# Patient Record
Sex: Female | Born: 1970 | Race: White | Hispanic: No | Marital: Married | Smoking: Current every day smoker
Health system: Southern US, Community
[De-identification: ages and names within clinical notes are randomized; demographics above are authoritative.]

## PROBLEM LIST (undated history)

## (undated) DIAGNOSIS — R519 Headache, unspecified: Secondary | ICD-10-CM

## (undated) DIAGNOSIS — M810 Age-related osteoporosis without current pathological fracture: Secondary | ICD-10-CM

## (undated) DIAGNOSIS — M5126 Other intervertebral disc displacement, lumbar region: Secondary | ICD-10-CM

## (undated) DIAGNOSIS — R51 Headache: Secondary | ICD-10-CM

## (undated) DIAGNOSIS — M5136 Other intervertebral disc degeneration, lumbar region: Secondary | ICD-10-CM

## (undated) DIAGNOSIS — J45909 Unspecified asthma, uncomplicated: Secondary | ICD-10-CM

## (undated) HISTORY — PX: SHOULDER ARTHROSCOPY: SHX128

## (undated) HISTORY — PX: TUBAL LIGATION: SHX77

---

## 2016-02-24 ENCOUNTER — Ambulatory Visit (INDEPENDENT_AMBULATORY_CARE_PROVIDER_SITE_OTHER): Payer: Federal, State, Local not specified - PPO

## 2016-02-24 ENCOUNTER — Ambulatory Visit
Admission: EM | Admit: 2016-02-24 | Discharge: 2016-02-24 | Disposition: A | Discharge status: Home / Self Care | Payer: Federal, State, Local not specified - PPO | Attending: Family Medicine | Admitting: Family Medicine

## 2016-02-24 ENCOUNTER — Encounter: Payer: Self-pay | Admitting: *Deleted

## 2016-02-24 DIAGNOSIS — J40 Bronchitis, not specified as acute or chronic: Secondary | ICD-10-CM

## 2016-02-24 DIAGNOSIS — J4520 Mild intermittent asthma, uncomplicated: Secondary | ICD-10-CM

## 2016-02-24 HISTORY — DX: Unspecified asthma, uncomplicated: J45.909

## 2016-02-24 MED ORDER — ALBUTEROL SULFATE HFA 108 (90 BASE) MCG/ACT IN AERS: 2.0000 | INHALATION_SPRAY | Freq: Four times a day (QID) | RESPIRATORY_TRACT | Status: AC | PRN

## 2016-02-24 MED ORDER — BENZONATATE 100 MG PO CAPS: 100.0000 mg | ORAL_CAPSULE | Freq: Three times a day (TID) | ORAL | Status: DC

## 2016-02-24 MED ORDER — DOXYCYCLINE HYCLATE 100 MG PO TABS: 100.0000 mg | ORAL_TABLET | Freq: Two times a day (BID) | ORAL | Status: DC

## 2016-02-24 NOTE — ED Notes (Signed)
Productive cough- clear, and intermittent fever x3 weeks. At onset pt had a sore throat but that has resolved.

## 2016-02-24 NOTE — ED Provider Notes (Signed)
CSN: [id number]     Arrival date & time 02/24/16  1222 History   First MD Initiated Contact with Patient 02/24/16 1437     Chief Complaint  Patient presents with  . Cough  . Fever  . Nasal Congestion   (Consider location/radiation/quality/duration/timing/severity/associated sxs/prior Treatment) Patient is a 45 y.o. female presenting with cough and fever.  Cough Cough characteristics:  Productive Sputum characteristics:  Yellow Severity:  Moderate Onset quality:  Gradual Duration:  3 weeks Timing:  Intermittent Progression:  Waxing and waning Chronicity:  New Smoker: yes   Context: weather changes   Relieved by:  Nothing Ineffective treatments:  None tried Associated symptoms: chills, diaphoresis, fever, rhinorrhea, shortness of breath, sinus congestion and sore throat   Associated symptoms: no ear pain, no headaches and no myalgias   Fever:    Duration:  4 days   Timing:  Intermittent   Temp source:  Oral   Progression:  Worsening Rhinorrhea:    Quality:  Clear   Timing:  Intermittent   Progression:  Worsening Shortness of breath:    Severity:  Mild   Timing:  Intermittent Sore throat:    Severity:  Mild   Onset quality:  Gradual   Duration:  4 days   Progression:  Waxing and waning Fever Associated symptoms: chills, congestion, cough, rhinorrhea and sore throat   Associated symptoms: no ear pain, no headaches and no myalgias     Past Medical History  Diagnosis Date  . Asthma    Past Surgical History  Procedure Laterality Date  . Shoulder arthroscopy Left    History reviewed. No pertinent family history. Social History  Substance Use Topics  . Smoking status: Current Every Day Smoker -- 0.50 packs/day    Types: Cigarettes  . Smokeless tobacco: None  . Alcohol Use: Yes   OB History    No data available     Review of Systems  Constitutional: Positive for fever, chills and diaphoresis.  HENT: Positive for congestion, postnasal drip, rhinorrhea and  sore throat. Negative for ear pain, facial swelling and nosebleeds.   Respiratory: Positive for cough and shortness of breath. Negative for apnea.   Musculoskeletal: Negative for myalgias and arthralgias.  Skin: Negative.   Neurological: Negative for headaches.  Hematological: Negative.  Negative for adenopathy.  Psychiatric/Behavioral: Negative.     Allergies  Review of patient's allergies indicates no known allergies.  Home Medications   Prior to Admission medications   Medication Sig Start Date End Date Taking? Authorizing Provider  gabapentin (NEURONTIN) 100 MG capsule Take 100 mg by mouth 3 (three) times daily.   Yes Historical Provider, MD  naproxen (NAPROSYN) 500 MG tablet Take 500 mg by mouth 2 (two) times daily with a meal.   Yes Historical Provider, MD  albuterol (PROVENTIL HFA;VENTOLIN HFA) 108 (90 Base) MCG/ACT inhaler Inhale 2 puffs into the lungs every 6 (six) hours as needed for wheezing or shortness of breath. 02/24/16   Juline Patch, MD  benzonatate (TESSALON) 100 MG capsule Take 1 capsule (100 mg total) by mouth every 8 (eight) hours. 02/24/16   Juline Patch, MD  doxycycline (VIBRA-TABS) 100 MG tablet Take 1 tablet (100 mg total) by mouth 2 (two) times daily. 02/24/16   Juline Patch, MD  SUMAtriptan (IMITREX) 25 MG tablet Take 25 mg by mouth every 2 (two) hours as needed for migraine. May repeat in 2 hours if headache persists or recurs.    Historical Provider, MD   Meds Ordered  and Administered this Visit  Medications - No data to display  BP 111/64 mmHg  Pulse 86  Temp(Src) 98 F (36.7 C) (Oral)  Resp 16  Ht 5\' 5"  (1.651 m)  Wt 122 lb (55.339 kg)  BMI 20.30 kg/m2  SpO2 100%  LMP 02/24/2016 (Exact Date) No data found.   Physical Exam  Constitutional: She appears well-developed and well-nourished.  HENT:  Head: Normocephalic.  Right Ear: External ear normal.  Left Ear: External ear normal.  Nose: Nose normal.  Mouth/Throat: Oropharynx is clear and  moist.  Eyes: Pupils are equal, round, and reactive to light.  Neck: Normal range of motion.  Cardiovascular: Normal rate, regular rhythm and normal heart sounds.   No murmur heard. Pulmonary/Chest: Effort normal. No respiratory distress. She has wheezes. She has no rales. She exhibits no tenderness.  Abdominal: Soft.  Nursing note and vitals reviewed.   ED Course  Procedures (including critical care time)  Labs Review Labs Reviewed - No data to display  Imaging Review Dg Chest 2 View  02/24/2016  CLINICAL DATA:  Productive cough x3 weeks EXAM: CHEST  2 VIEW COMPARISON:  None. FINDINGS: Lungs are clear.  No pleural effusion or pneumothorax. The heart is normal in size. Visualized osseous structures are within normal limits. IMPRESSION: No evidence of acute cardiopulmonary disease. Electronically Signed   By: Julian Hy M.D.   On: 02/24/2016 14:45     Visual Acuity Review  Right Eye Distance:   Left Eye Distance:   Bilateral Distance:    Right Eye Near:   Left Eye Near:    Bilateral Near:         MDM   1. Bronchitis   2. Asthma, mild intermittent, uncomplicated    New Prescriptions   ALBUTEROL (PROVENTIL HFA;VENTOLIN HFA) 108 (90 BASE) MCG/ACT INHALER    Inhale 2 puffs into the lungs every 6 (six) hours as needed for wheezing or shortness of breath.   BENZONATATE (TESSALON) 100 MG CAPSULE    Take 1 capsule (100 mg total) by mouth every 8 (eight) hours.   DOXYCYCLINE (VIBRA-TABS) 100 MG TABLET    Take 1 tablet (100 mg total) by mouth 2 (two) times daily.     Juline Patch, MD 02/25/16 1224

## 2016-02-24 NOTE — Discharge Instructions (Signed)
Asthma, Acute Bronchospasm °Acute bronchospasm caused by asthma is also referred to as an asthma attack. Bronchospasm means your air passages become narrowed. The narrowing is caused by inflammation and tightening of the muscles in the air tubes (bronchi) in your lungs. This can make it hard to breathe or cause you to wheeze and cough. °CAUSES °Possible triggers are: °· Animal dander from the skin, hair, or feathers of animals. °· Dust mites contained in house dust. °· Cockroaches. °· Pollen from trees or grass. °· Mold. °· Cigarette or tobacco smoke. °· Air pollutants such as dust, household cleaners, hair sprays, aerosol sprays, paint fumes, strong chemicals, or strong odors. °· Cold air or weather changes. Cold air may trigger inflammation. Winds increase molds and pollens in the air. °· Strong emotions such as crying or laughing hard. °· Stress. °· Certain medicines such as aspirin or beta-blockers. °· Sulfites in foods and drinks, such as dried fruits and wine. °· Infections or inflammatory conditions, such as a flu, cold, or inflammation of the nasal membranes (rhinitis). °· Gastroesophageal reflux disease (GERD). GERD is a condition where stomach acid backs up into your esophagus. °· Exercise or strenuous activity. °SIGNS AND SYMPTOMS  °· Wheezing. °· Excessive coughing, particularly at night. °· Chest tightness. °· Shortness of breath. °DIAGNOSIS  °Your health care provider will ask you about your medical history and perform a physical exam. A chest X-ray or blood testing may be performed to look for other causes of your symptoms or other conditions that may have triggered your asthma attack.  °TREATMENT  °Treatment is aimed at reducing inflammation and opening up the airways in your lungs.  Most asthma attacks are treated with inhaled medicines. These include quick relief or rescue medicines (such as bronchodilators) and controller medicines (such as inhaled corticosteroids). These medicines are sometimes  given through an inhaler or a nebulizer. Systemic steroid medicine taken by mouth or given through an IV tube also can be used to reduce the inflammation when an attack is moderate or severe. Antibiotic medicines are only used if a bacterial infection is present.  °HOME CARE INSTRUCTIONS  °· Rest. °· Drink plenty of liquids. This helps the mucus to remain thin and be easily coughed up. Only use caffeine in moderation and do not use alcohol until you have recovered from your illness. °· Do not smoke. Avoid being exposed to secondhand smoke. °· You play a critical role in keeping yourself in good health. Avoid exposure to things that cause you to wheeze or to have breathing problems. °· Keep your medicines up-to-date and available. Carefully follow your health care provider's treatment plan. °· Take your medicine exactly as prescribed. °· When pollen or pollution is bad, keep windows closed and use an air conditioner or go to places with air conditioning. °· Asthma requires careful medical care. See your health care provider for a follow-up as advised. If you are more than [redacted] weeks pregnant and you were prescribed any new medicines, let your obstetrician know about the visit and how you are doing. Follow up with your health care provider as directed. °· After you have recovered from your asthma attack, make an appointment with your outpatient doctor to talk about ways to reduce the likelihood of future attacks. If you do not have a doctor who manages your asthma, make an appointment with a primary care doctor to discuss your asthma. °SEEK IMMEDIATE MEDICAL CARE IF:  °· You are getting worse. °· You have trouble breathing. If severe, call your local   emergency services (911 in the [place]  You develop chest pain or discomfort.  You are vomiting.  You are not able to keep fluids down.  You are coughing up yellow, green, brown, or bloody sputum.  You have a fever and your symptoms suddenly get worse.  You have  trouble swallowing. MAKE SURE YOU:   Understand these instructions.  Will watch your condition.  Will get help right away if you are not doing well or get worse.   This information is not intended to replace advice given to you by your health care provider. Make sure you discuss any questions you have with your health care provider.   Document Released: 03/04/2007 Document Revised: 11/22/2013 Document Reviewed: 05/25/2013 Elsevier Interactive Patient Education 2016 [place].  Asthma, Acute Bronchospasm Acute bronchospasm caused by asthma is also referred to as an asthma attack. Bronchospasm means your air passages become narrowed. The narrowing is caused by inflammation and tightening of the muscles in the air tubes (bronchi) in your lungs. This can make it hard to breathe or cause you to wheeze and cough. CAUSES Possible triggers are:  Animal dander from the skin, hair, or feathers of animals.  Dust mites contained in house dust.  Cockroaches.  Pollen from trees or grass.  Mold.  Cigarette or tobacco smoke.  Air pollutants such as dust, household cleaners, hair sprays, aerosol sprays, paint fumes, strong chemicals, or strong odors.  Cold air or weather changes. Cold air may trigger inflammation. Winds increase molds and pollens in the air.  Strong emotions such as crying or laughing hard.  Stress.  Certain medicines such as aspirin or beta-blockers.  Sulfites in foods and drinks, such as dried fruits and wine.  Infections or inflammatory conditions, such as a flu, cold, or inflammation of the nasal membranes (rhinitis).  Gastroesophageal reflux disease (GERD). GERD is a condition where stomach acid backs up into your esophagus.  Exercise or strenuous activity. SIGNS AND SYMPTOMS   Wheezing.  Excessive coughing, particularly at night.  Chest tightness.  Shortness of breath. DIAGNOSIS  Your health care provider will ask you about your medical history and  perform a physical exam. A chest X-ray or blood testing may be performed to look for other causes of your symptoms or other conditions that may have triggered your asthma attack. TREATMENT  Treatment is aimed at reducing inflammation and opening up the airways in your lungs. Most asthma attacks are treated with inhaled medicines. These include quick relief or rescue medicines (such as bronchodilators) and controller medicines (such as inhaled corticosteroids). These medicines are sometimes given through an inhaler or a nebulizer. Systemic steroid medicine taken by mouth or given through an IV tube also can be used to reduce the inflammation when an attack is moderate or severe. Antibiotic medicines are only used if a bacterial infection is present.  HOME CARE INSTRUCTIONS   Rest.  Drink plenty of liquids. This helps the mucus to remain thin and be easily coughed up. Only use caffeine in moderation and do not use alcohol until you have recovered from your illness.  Do not smoke. Avoid being exposed to secondhand smoke.  You play a critical role in keeping yourself in good health. Avoid exposure to things that cause you to wheeze or to have breathing problems.  Keep your medicines up-to-date and available. Carefully follow your health care provider's treatment plan.  Take your medicine exactly as prescribed.  When pollen or pollution is bad, keep windows closed and use an  air conditioner or go to places with air conditioning.  Asthma requires careful medical care. See your health care provider for a follow-up as advised. If you are more than [redacted] weeks pregnant and you were prescribed any new medicines, let your obstetrician know about the visit and how you are doing. Follow up with your health care provider as directed.  After you have recovered from your asthma attack, make an appointment with your outpatient doctor to talk about ways to reduce the likelihood of future attacks. If you do not  have a doctor who manages your asthma, make an appointment with a primary care doctor to discuss your asthma. SEEK IMMEDIATE MEDICAL CARE IF:   You are getting worse.  You have trouble breathing. If severe, call your local emergency services (911 in the [place]  You develop chest pain or discomfort.  You are vomiting.  You are not able to keep fluids down.  You are coughing up yellow, green, brown, or bloody sputum.  You have a fever and your symptoms suddenly get worse.  You have trouble swallowing. MAKE SURE YOU:   Understand these instructions.  Will watch your condition.  Will get help right away if you are not doing well or get worse.   This information is not intended to replace advice given to you by your health care provider. Make sure you discuss any questions you have with your health care provider.   Document Released: 03/04/2007 Document Revised: 11/22/2013 Document Reviewed: 05/25/2013 Elsevier Interactive Patient Education 2016 [place].  Asthma, Acute Bronchospasm Acute bronchospasm caused by asthma is also referred to as an asthma attack. Bronchospasm means your air passages become narrowed. The narrowing is caused by inflammation and tightening of the muscles in the air tubes (bronchi) in your lungs. This can make it hard to breathe or cause you to wheeze and cough. CAUSES Possible triggers are:  Animal dander from the skin, hair, or feathers of animals.  Dust mites contained in house dust.  Cockroaches.  Pollen from trees or grass.  Mold.  Cigarette or tobacco smoke.  Air pollutants such as dust, household cleaners, hair sprays, aerosol sprays, paint fumes, strong chemicals, or strong odors.  Cold air or weather changes. Cold air may trigger inflammation. Winds increase molds and pollens in the air.  Strong emotions such as crying or laughing hard.  Stress.  Certain medicines such as aspirin or beta-blockers.  Sulfites in foods and  drinks, such as dried fruits and wine.  Infections or inflammatory conditions, such as a flu, cold, or inflammation of the nasal membranes (rhinitis).  Gastroesophageal reflux disease (GERD). GERD is a condition where stomach acid backs up into your esophagus.  Exercise or strenuous activity. SIGNS AND SYMPTOMS   Wheezing.  Excessive coughing, particularly at night.  Chest tightness.  Shortness of breath. DIAGNOSIS  Your health care provider will ask you about your medical history and perform a physical exam. A chest X-ray or blood testing may be performed to look for other causes of your symptoms or other conditions that may have triggered your asthma attack. TREATMENT  Treatment is aimed at reducing inflammation and opening up the airways in your lungs. Most asthma attacks are treated with inhaled medicines. These include quick relief or rescue medicines (such as bronchodilators) and controller medicines (such as inhaled corticosteroids). These medicines are sometimes given through an inhaler or a nebulizer. Systemic steroid medicine taken by mouth or given through an IV tube also can be used to reduce  the inflammation when an attack is moderate or severe. Antibiotic medicines are only used if a bacterial infection is present.  HOME CARE INSTRUCTIONS   Rest.  Drink plenty of liquids. This helps the mucus to remain thin and be easily coughed up. Only use caffeine in moderation and do not use alcohol until you have recovered from your illness.  Do not smoke. Avoid being exposed to secondhand smoke.  You play a critical role in keeping yourself in good health. Avoid exposure to things that cause you to wheeze or to have breathing problems.  Keep your medicines up-to-date and available. Carefully follow your health care provider's treatment plan.  Take your medicine exactly as prescribed.  When pollen or pollution is bad, keep windows closed and use an air conditioner or go to  places with air conditioning.  Asthma requires careful medical care. See your health care provider for a follow-up as advised. If you are more than [redacted] weeks pregnant and you were prescribed any new medicines, let your obstetrician know about the visit and how you are doing. Follow up with your health care provider as directed.  After you have recovered from your asthma attack, make an appointment with your outpatient doctor to talk about ways to reduce the likelihood of future attacks. If you do not have a doctor who manages your asthma, make an appointment with a primary care doctor to discuss your asthma. SEEK IMMEDIATE MEDICAL CARE IF:   You are getting worse.  You have trouble breathing. If severe, call your local emergency services (911 in the [place]  You develop chest pain or discomfort.  You are vomiting.  You are not able to keep fluids down.  You are coughing up yellow, green, brown, or bloody sputum.  You have a fever and your symptoms suddenly get worse.  You have trouble swallowing. MAKE SURE YOU:   Understand these instructions.  Will watch your condition.  Will get help right away if you are not doing well or get worse.   This information is not intended to replace advice given to you by your health care provider. Make sure you discuss any questions you have with your health care provider.   Document Released: 03/04/2007 Document Revised: 11/22/2013 Document Reviewed: 05/25/2013 Elsevier Interactive Patient Education [employer].

## 2017-05-27 ENCOUNTER — Ambulatory Visit
Admission: EM | Admit: 2017-05-27 | Discharge: 2017-05-27 | Discharge status: Absent without leave | Payer: Federal, State, Local not specified - PPO

## 2017-07-03 ENCOUNTER — Other Ambulatory Visit
Admission: RE | Admit: 2017-07-03 | Discharge: 2017-07-03 | Disposition: A | Discharge status: Home / Self Care | Payer: Federal, State, Local not specified - PPO | Source: Ambulatory Visit | Attending: Gastroenterology | Admitting: Gastroenterology

## 2017-07-03 DIAGNOSIS — K581 Irritable bowel syndrome with constipation: Secondary | ICD-10-CM | POA: Diagnosis not present

## 2017-07-21 ENCOUNTER — Other Ambulatory Visit: Payer: Self-pay | Admitting: Orthopedic Surgery

## 2017-07-21 DIAGNOSIS — M542 Cervicalgia: Secondary | ICD-10-CM

## 2017-07-21 DIAGNOSIS — R202 Paresthesia of skin: Secondary | ICD-10-CM

## 2017-07-21 DIAGNOSIS — M4722 Other spondylosis with radiculopathy, cervical region: Secondary | ICD-10-CM

## 2017-07-21 DIAGNOSIS — R200 Anesthesia of skin: Secondary | ICD-10-CM

## 2017-07-22 LAB — MISCELLANEOUS TEST

## 2017-12-02 ENCOUNTER — Ambulatory Visit
Admission: RE | Admit: 2017-12-02 | Discharge: 2017-12-02 | Disposition: A | Discharge status: Home / Self Care | Payer: Federal, State, Local not specified - PPO | Source: Ambulatory Visit | Attending: Orthopedic Surgery | Admitting: Orthopedic Surgery

## 2017-12-02 HISTORY — DX: Headache: R51

## 2017-12-02 HISTORY — DX: Headache, unspecified: R51.9

## 2017-12-02 NOTE — Patient Instructions (Signed)
Your procedure is scheduled on: 12/14/17 Report to Day Surgery.[facility]OND FLOOR To find out your arrival time please call [phone] between 1PM - 3PM on 12/11/17.  Remember: Instructions that are not followed completely may result in serious medical risk, up to and including death, or upon the discretion of your surgeon and anesthesiologist your surgery may need to be rescheduled.     _X__ 1. Do not eat food after midnight the night before your procedure.                 No gum chewing or hard candies. You may drink clear liquids up to 2 hours                 before you are scheduled to arrive for your surgery- DO not drink clear                 liquids within 2 hours of the start of your surgery.                 Clear Liquids include:  water, apple juice without pulp, clear carbohydrate                 drink such as Clearfast of Gartorade, Black Coffee or Tea (Do not add                 anything to coffee or tea).     _X__ 2.  No Alcohol for 24 hours before or after surgery.   _X__ 3.  Do Not Smoke or use e-cigarettes For 24 Hours Prior to Your Surgery.                 Do not use any chewable tobacco products for at least 6 hours prior to                 surgery.  ____  4.  Bring all medications with you on the day of surgery if instructed.   __X__  5.  Notify your doctor if there is any change in your medical condition      (cold, fever, infections).     Do not wear jewelry, make-up, hairpins, clips or nail polish. Do not wear lotions, powders, or perfumes. You may wear deodorant. Do not shave 48 hours prior to surgery. Men may shave face and neck. Do not bring valuables to the hospital.    [facility] is not responsible for any belongings or valuables.  Contacts, dentures or bridgework may not be worn into surgery. Leave your suitcase in the car. After surgery it may be brought to your room. For patients admitted to the hospital, discharge time  is determined by your treatment team.   Patients discharged the day of surgery will not be allowed to drive home.   ___ Take these medicines the morning of surgery with A SIP OF WATER:    1. NONE  2.   3.   4.  5.  6.  ____ Fleet Enema (as directed)   ____ Use CHG Soap as directed  __X__ Use inhalers on the day of surgery  ____ Stop metformin 2 days prior to surgery    ____ Take 1/2 of usual insulin dose the night before surgery. No insulin the morning          of surgery.   _X___ Stop Coumadin/Plavix/aspirin on    STOP EXCEDRIN MIGRAINE UNTIL AFTER SURGERY  __X__ Stop Anti-inflammatories on STOP LODINE 7 TO  10 DAYS BEFORE SURGERY ____ Stop supplements until after surgery.    ____ Bring C-Pap to the hospital.

## 2017-12-10 ENCOUNTER — Encounter: Payer: Self-pay | Admitting: *Deleted

## 2017-12-13 MED ORDER — CEFAZOLIN SODIUM-DEXTROSE 2-4 GM/100ML-% IV SOLN
2.0000 g | Freq: Once | INTRAVENOUS | Status: AC
  Administered 2017-12-14: 2 g via INTRAVENOUS

## 2017-12-14 ENCOUNTER — Encounter
Admission: RE | Disposition: A | Discharge status: Home / Self Care | Payer: Self-pay | Source: Ambulatory Visit | Attending: Orthopedic Surgery

## 2017-12-14 ENCOUNTER — Ambulatory Visit: Payer: Worker's Compensation

## 2017-12-14 ENCOUNTER — Ambulatory Visit: Payer: Worker's Compensation | Admitting: Certified Registered"

## 2017-12-14 ENCOUNTER — Ambulatory Visit
Admission: RE | Admit: 2017-12-14 | Discharge: 2017-12-14 | Disposition: A | Discharge status: Home / Self Care | Payer: Worker's Compensation | Source: Ambulatory Visit | Attending: Orthopedic Surgery | Admitting: Orthopedic Surgery

## 2017-12-14 ENCOUNTER — Encounter: Payer: Self-pay | Admitting: *Deleted

## 2017-12-14 ENCOUNTER — Other Ambulatory Visit: Payer: Self-pay

## 2017-12-14 DIAGNOSIS — M7552 Bursitis of left shoulder: Secondary | ICD-10-CM | POA: Insufficient documentation

## 2017-12-14 DIAGNOSIS — M65812 Other synovitis and tenosynovitis, left shoulder: Secondary | ICD-10-CM | POA: Diagnosis not present

## 2017-12-14 DIAGNOSIS — M75112 Incomplete rotator cuff tear or rupture of left shoulder, not specified as traumatic: Secondary | ICD-10-CM | POA: Insufficient documentation

## 2017-12-14 DIAGNOSIS — Z79899 Other long term (current) drug therapy: Secondary | ICD-10-CM | POA: Insufficient documentation

## 2017-12-14 DIAGNOSIS — M7502 Adhesive capsulitis of left shoulder: Secondary | ICD-10-CM | POA: Insufficient documentation

## 2017-12-14 DIAGNOSIS — Z419 Encounter for procedure for purposes other than remedying health state, unspecified: Secondary | ICD-10-CM

## 2017-12-14 DIAGNOSIS — Z8249 Family history of ischemic heart disease and other diseases of the circulatory system: Secondary | ICD-10-CM | POA: Insufficient documentation

## 2017-12-14 DIAGNOSIS — J45909 Unspecified asthma, uncomplicated: Secondary | ICD-10-CM | POA: Diagnosis not present

## 2017-12-14 DIAGNOSIS — M7542 Impingement syndrome of left shoulder: Secondary | ICD-10-CM | POA: Diagnosis not present

## 2017-12-14 DIAGNOSIS — M810 Age-related osteoporosis without current pathological fracture: Secondary | ICD-10-CM | POA: Diagnosis not present

## 2017-12-14 HISTORY — DX: Other intervertebral disc displacement, lumbar region: M51.26

## 2017-12-14 HISTORY — DX: Age-related osteoporosis without current pathological fracture: M81.0

## 2017-12-14 HISTORY — PX: SHOULDER ARTHROSCOPY WITH OPEN ROTATOR CUFF REPAIR: SHX6092

## 2017-12-14 HISTORY — DX: Other intervertebral disc degeneration, lumbar region: M51.36

## 2017-12-14 LAB — POCT PREGNANCY, URINE: Preg Test, Ur: NEGATIVE

## 2017-12-14 SURGERY — ARTHROSCOPY, SHOULDER WITH REPAIR, ROTATOR CUFF, OPEN
Anesthesia: General | Site: Shoulder | Laterality: Left | Wound class: Clean

## 2017-12-14 MED ORDER — LIDOCAINE HCL (PF) 2 % IJ SOLN
INTRAMUSCULAR | Status: AC
  Filled 2017-12-14: qty 10

## 2017-12-14 MED ORDER — LIDOCAINE-EPINEPHRINE 1 %-1:100000 IJ SOLN
INTRAMUSCULAR | Status: AC
  Filled 2017-12-14: qty 1

## 2017-12-14 MED ORDER — ONDANSETRON HCL 4 MG/2ML IJ SOLN
INTRAMUSCULAR | Status: DC | PRN
  Administered 2017-12-14: 4 mg via INTRAVENOUS

## 2017-12-14 MED ORDER — FENTANYL CITRATE (PF) 100 MCG/2ML IJ SOLN
INTRAMUSCULAR | Status: AC
  Administered 2017-12-14: 50 ug via INTRAVENOUS
  Filled 2017-12-14: qty 2

## 2017-12-14 MED ORDER — LIDOCAINE-EPINEPHRINE 1 %-1:100000 IJ SOLN
INTRAMUSCULAR | Status: DC | PRN
  Administered 2017-12-14: 8 mL via INTRAMUSCULAR
  Administered 2017-12-14: 5 mL via INTRAMUSCULAR

## 2017-12-14 MED ORDER — CEFAZOLIN SODIUM-DEXTROSE 2-4 GM/100ML-% IV SOLN
INTRAVENOUS | Status: AC
  Filled 2017-12-14: qty 100

## 2017-12-14 MED ORDER — EPINEPHRINE PF 1 MG/ML IJ SOLN
INTRAMUSCULAR | Status: DC | PRN
  Administered 2017-12-14: 4 mL
  Administered 2017-12-14: 2 mL
  Administered 2017-12-14 (×3): 4 mL

## 2017-12-14 MED ORDER — PROPOFOL 10 MG/ML IV BOLUS
INTRAVENOUS | Status: AC
  Filled 2017-12-14: qty 20

## 2017-12-14 MED ORDER — BUPIVACAINE HCL (PF) 0.5 % IJ SOLN
INTRAMUSCULAR | Status: AC
  Filled 2017-12-14: qty 10

## 2017-12-14 MED ORDER — EPINEPHRINE 30 MG/30ML IJ SOLN
INTRAMUSCULAR | Status: AC
  Filled 2017-12-14: qty 2

## 2017-12-14 MED ORDER — DEXAMETHASONE SODIUM PHOSPHATE 10 MG/ML IJ SOLN
INTRAMUSCULAR | Status: DC | PRN
  Administered 2017-12-14: 5 mg via INTRAVENOUS

## 2017-12-14 MED ORDER — FENTANYL CITRATE (PF) 100 MCG/2ML IJ SOLN: 25.0000 ug | INTRAMUSCULAR | Status: DC | PRN

## 2017-12-14 MED ORDER — MIDAZOLAM HCL 2 MG/2ML IJ SOLN
1.0000 mg | Freq: Once | INTRAMUSCULAR | Status: AC
  Administered 2017-12-14: 1 mg via INTRAVENOUS

## 2017-12-14 MED ORDER — MIDAZOLAM HCL 2 MG/2ML IJ SOLN
INTRAMUSCULAR | Status: AC
  Administered 2017-12-14: 1 mg via INTRAVENOUS
  Filled 2017-12-14: qty 2

## 2017-12-14 MED ORDER — BUPIVACAINE LIPOSOME 1.3 % IJ SUSP
INTRAMUSCULAR | Status: DC | PRN
  Administered 2017-12-14: 20 mL via PERINEURAL

## 2017-12-14 MED ORDER — PROMETHAZINE HCL 25 MG/ML IJ SOLN: 6.2500 mg | INTRAMUSCULAR | Status: DC | PRN

## 2017-12-14 MED ORDER — FAMOTIDINE 20 MG PO TABS
ORAL_TABLET | ORAL | Status: AC
  Administered 2017-12-14: 20 mg via ORAL
  Filled 2017-12-14: qty 1

## 2017-12-14 MED ORDER — FENTANYL CITRATE (PF) 100 MCG/2ML IJ SOLN
50.0000 ug | Freq: Once | INTRAMUSCULAR | Status: AC
  Administered 2017-12-14: 50 ug via INTRAVENOUS

## 2017-12-14 MED ORDER — BUPIVACAINE HCL (PF) 0.5 % IJ SOLN
INTRAMUSCULAR | Status: DC | PRN
  Administered 2017-12-14: 10 mL via PERINEURAL

## 2017-12-14 MED ORDER — IBUPROFEN 800 MG PO TABS: 800.0000 mg | ORAL_TABLET | Freq: Three times a day (TID) | ORAL | 0 refills | Status: AC

## 2017-12-14 MED ORDER — LACTATED RINGERS IV SOLN
INTRAVENOUS | Status: DC
  Administered 2017-12-14 (×3): via INTRAVENOUS

## 2017-12-14 MED ORDER — LIDOCAINE HCL (PF) 1 % IJ SOLN
INTRAMUSCULAR | Status: DC | PRN
  Administered 2017-12-14: 5 mL via SUBCUTANEOUS

## 2017-12-14 MED ORDER — ROCURONIUM BROMIDE 50 MG/5ML IV SOLN
INTRAVENOUS | Status: AC
  Filled 2017-12-14: qty 1

## 2017-12-14 MED ORDER — OXYCODONE HCL 5 MG PO TABS: 5.0000 mg | ORAL_TABLET | ORAL | 0 refills | Status: DC | PRN

## 2017-12-14 MED ORDER — LIDOCAINE HCL (CARDIAC) 20 MG/ML IV SOLN
INTRAVENOUS | Status: DC | PRN
  Administered 2017-12-14: 50 mg via INTRAVENOUS

## 2017-12-14 MED ORDER — BUPIVACAINE LIPOSOME 1.3 % IJ SUSP
INTRAMUSCULAR | Status: AC
  Filled 2017-12-14: qty 20

## 2017-12-14 MED ORDER — DEXAMETHASONE SODIUM PHOSPHATE 10 MG/ML IJ SOLN
INTRAMUSCULAR | Status: AC
  Filled 2017-12-14: qty 1

## 2017-12-14 MED ORDER — TRIAMCINOLONE ACETONIDE 40 MG/ML IJ SUSP
INTRAMUSCULAR | Status: AC
  Filled 2017-12-14: qty 2

## 2017-12-14 MED ORDER — ACETAMINOPHEN 500 MG PO TABS: 1000.0000 mg | ORAL_TABLET | Freq: Three times a day (TID) | ORAL | 2 refills | Status: DC

## 2017-12-14 MED ORDER — FENTANYL CITRATE (PF) 100 MCG/2ML IJ SOLN
INTRAMUSCULAR | Status: AC
  Filled 2017-12-14: qty 2

## 2017-12-14 MED ORDER — BUPIVACAINE HCL (PF) 0.5 % IJ SOLN
INTRAMUSCULAR | Status: AC
  Filled 2017-12-14: qty 30

## 2017-12-14 MED ORDER — TRIAMCINOLONE ACETONIDE 40 MG/ML IJ SUSP
INTRAMUSCULAR | Status: DC | PRN
Start: 2017-12-14 — End: 2017-12-14
  Administered 2017-12-14: 80 mg

## 2017-12-14 MED ORDER — ONDANSETRON HCL 4 MG/2ML IJ SOLN
INTRAMUSCULAR | Status: AC
  Filled 2017-12-14: qty 2

## 2017-12-14 MED ORDER — ASPIRIN EC 325 MG PO TBEC: 325.0000 mg | DELAYED_RELEASE_TABLET | Freq: Every day | ORAL | 0 refills | Status: AC

## 2017-12-14 MED ORDER — FENTANYL CITRATE (PF) 100 MCG/2ML IJ SOLN
INTRAMUSCULAR | Status: DC | PRN
  Administered 2017-12-14 (×2): 50 ug via INTRAVENOUS

## 2017-12-14 MED ORDER — SUGAMMADEX SODIUM 200 MG/2ML IV SOLN
INTRAVENOUS | Status: DC | PRN
  Administered 2017-12-14: 200 mg via INTRAVENOUS

## 2017-12-14 MED ORDER — LIDOCAINE HCL (PF) 1 % IJ SOLN
INTRAMUSCULAR | Status: AC
  Filled 2017-12-14: qty 5

## 2017-12-14 MED ORDER — PHENYLEPHRINE HCL 10 MG/ML IJ SOLN
INTRAMUSCULAR | Status: DC | PRN
  Administered 2017-12-14 (×3): 100 ug via INTRAVENOUS

## 2017-12-14 MED ORDER — ROCURONIUM BROMIDE 100 MG/10ML IV SOLN
INTRAVENOUS | Status: DC | PRN
  Administered 2017-12-14: 50 mg via INTRAVENOUS

## 2017-12-14 MED ORDER — PROPOFOL 10 MG/ML IV BOLUS
INTRAVENOUS | Status: DC | PRN
  Administered 2017-12-14: 150 mg via INTRAVENOUS

## 2017-12-14 MED ORDER — FAMOTIDINE 20 MG PO TABS
20.0000 mg | ORAL_TABLET | Freq: Once | ORAL | Status: AC
  Administered 2017-12-14: 20 mg via ORAL

## 2017-12-14 MED ORDER — ONDANSETRON 4 MG PO TBDP: 4.0000 mg | ORAL_TABLET | Freq: Three times a day (TID) | ORAL | 0 refills | Status: DC | PRN

## 2017-12-14 SURGICAL SUPPLY — 80 items
ADAPTER IRRIG TUBE 2 SPIKE SOL (ADAPTER) ×6 IMPLANT
BLADE OSCILLATING/SAGITTAL (BLADE)
BLADE SW THK.38XMED LNG THN (BLADE) IMPLANT
BUR RADIUS 4.0X18.5 (BURR) ×3 IMPLANT
BUR RADIUS 5.5 (BURR) IMPLANT
CANNULA 5.75X7 CRYSTAL CLEAR (CANNULA) ×3 IMPLANT
CANNULA PARTIAL THREAD 2X7 (CANNULA) ×3 IMPLANT
CANNULA TWIST IN 8.25X9CM (CANNULA) IMPLANT
CHLORAPREP W/TINT 26ML (MISCELLANEOUS) ×3 IMPLANT
CLOSURE WOUND 1/2 X4 (GAUZE/BANDAGES/DRESSINGS)
COOLER POLAR GLACIER W/PUMP (MISCELLANEOUS) ×3 IMPLANT
COVER LIGHT HANDLE STERIS (MISCELLANEOUS) ×3 IMPLANT
CRADLE LAMINECT ARM (MISCELLANEOUS) ×3 IMPLANT
DERMABOND ADVANCED (GAUZE/BANDAGES/DRESSINGS)
DERMABOND ADVANCED .7 DNX12 (GAUZE/BANDAGES/DRESSINGS) IMPLANT
DRAPE C-ARM XRAY 36X54 (DRAPES) ×3 IMPLANT
DRAPE IMP U-DRAPE 54X76 (DRAPES) ×3 IMPLANT
DRAPE INCISE IOBAN 66X45 STRL (DRAPES) ×3 IMPLANT
DRAPE SHEET LG 3/4 BI-LAMINATE (DRAPES) ×6 IMPLANT
DRAPE STERI 35X30 U-POUCH (DRAPES) IMPLANT
DRAPE U-SHAPE 47X51 STRL (DRAPES) IMPLANT
DRSG TELFA 4X3 1S NADH ST (GAUZE/BANDAGES/DRESSINGS) ×9 IMPLANT
ELECT REM PT RETURN 9FT ADLT (ELECTROSURGICAL) ×3
ELECTRODE REM PT RTRN 9FT ADLT (ELECTROSURGICAL) ×1 IMPLANT
GAUZE PETRO XEROFOAM 1X8 (MISCELLANEOUS) ×3 IMPLANT
GAUZE SPONGE 4X4 12PLY STRL (GAUZE/BANDAGES/DRESSINGS) IMPLANT
GAUZE SPONGE NON-WVN 2X2 STRL (MISCELLANEOUS) ×5 IMPLANT
GLOVE BIOGEL PI IND STRL 8 (GLOVE) ×4 IMPLANT
GLOVE BIOGEL PI INDICATOR 8 (GLOVE) ×8
GLOVE SURG SYN 7.5  E (GLOVE) ×8
GLOVE SURG SYN 7.5 E (GLOVE) ×4 IMPLANT
GOWN STRL REUS W/ TWL LRG LVL3 (GOWN DISPOSABLE) ×3 IMPLANT
GOWN STRL REUS W/TWL LRG LVL3 (GOWN DISPOSABLE) ×6
GOWN STRL REUS W/TWL LRG LVL4 (GOWN DISPOSABLE) ×3 IMPLANT
IV LACTATED RINGER IRRG 3000ML (IV SOLUTION) ×36
IV LR IRRIG 3000ML ARTHROMATIC (IV SOLUTION) ×18 IMPLANT
KIT RM TURNOVER STRD PROC AR (KITS) ×3 IMPLANT
KIT STABILIZATION SHOULDER (MISCELLANEOUS) ×3 IMPLANT
KIT SUTURETAK 3.0 INSERT PERC (KITS) IMPLANT
MANIFOLD NEPTUNE II (INSTRUMENTS) ×3 IMPLANT
MASK FACE SPIDER DISP (MASK) ×3 IMPLANT
MAT ABSORB  FLUID 56X50 GRAY (MISCELLANEOUS) ×4
MAT ABSORB FLUID 56X50 GRAY (MISCELLANEOUS) ×2 IMPLANT
NDL SAFETY ECLIPSE 18X1.5 (NEEDLE) ×1 IMPLANT
NEEDLE HYPO 18GX1.5 SHARP (NEEDLE) ×2
NEEDLE HYPO 22GX1.5 SAFETY (NEEDLE) ×3 IMPLANT
NEEDLE MAYO 6 CRC TAPER PT (NEEDLE) IMPLANT
PACK ARTHROSCOPY SHOULDER (MISCELLANEOUS) ×3 IMPLANT
PAD ABD DERMACEA PRESS 5X9 (GAUZE/BANDAGES/DRESSINGS) IMPLANT
PAD WRAPON POLAR SHDR XLG (MISCELLANEOUS) ×1 IMPLANT
SET TUBE SUCT SHAVER OUTFL 24K (TUBING) ×3 IMPLANT
SET TUBE TIP INTRA-ARTICULAR (MISCELLANEOUS) ×3 IMPLANT
SLING ARM M TX990204 (SOFTGOODS) ×3 IMPLANT
SLING ULTRA II M (MISCELLANEOUS) IMPLANT
SPONGE VERSALON 2X2 STRL (MISCELLANEOUS) ×10
STAPLER SKIN PROX 35W (STAPLE) IMPLANT
STRAP SAFETY BODY (MISCELLANEOUS) ×3 IMPLANT
STRIP CLOSURE SKIN 1/2X4 (GAUZE/BANDAGES/DRESSINGS) IMPLANT
SUT ETHILON 3-0 (SUTURE) ×3 IMPLANT
SUT ETHILON 4-0 (SUTURE)
SUT ETHILON 4-0 FS2 18XMFL BLK (SUTURE)
SUT LASSO 90 DEG SD STR (SUTURE) IMPLANT
SUT MNCRL 4-0 (SUTURE)
SUT MNCRL 4-0 27XMFL (SUTURE)
SUT PROLENE 0 CT 2 (SUTURE) IMPLANT
SUT PROLENE 6 0 P 1 18 (SUTURE) IMPLANT
SUT TICRON 2-0 30IN 311381 (SUTURE) IMPLANT
SUT VIC AB 0 CT1 36 (SUTURE) IMPLANT
SUT VIC AB 2-0 CT2 27 (SUTURE) IMPLANT
SUT VICRYL 3-0 27IN (SUTURE) IMPLANT
SUTURE ETHLN 4-0 FS2 18XMF BLK (SUTURE) IMPLANT
SUTURE MNCRL 4-0 27XMF (SUTURE) IMPLANT
SYR 10ML LL (SYRINGE) ×6 IMPLANT
TAPE CLOTH 3X10 WHT NS LF (GAUZE/BANDAGES/DRESSINGS) IMPLANT
TAPE MICROFOAM 4IN (TAPE) IMPLANT
TUBING ARTHRO INFLOW-ONLY STRL (TUBING) ×3 IMPLANT
TUBING CONNECTING 10 (TUBING) IMPLANT
TUBING CONNECTING 10' (TUBING)
WAND HAND CNTRL MULTIVAC 90 (MISCELLANEOUS) ×3 IMPLANT
WRAPON POLAR PAD SHDR XLG (MISCELLANEOUS) ×3

## 2017-12-14 NOTE — Anesthesia Procedure Notes (Signed)
Anesthesia Regional Block: Interscalene brachial plexus block   Pre-Anesthetic Checklist: ,, timeout performed, Correct Patient, Correct Site, Correct Laterality, Correct Procedure, Correct Position, site marked, Risks and benefits discussed,  Surgical consent,  Pre-op evaluation,  At surgeon's request and post-op pain management  Laterality: Left and Upper  Prep: chloraprep       Needles:  Injection technique: Single-shot  Needle Type: Stimiplex     Needle Length: 5cm  Needle Gauge: 22     Additional Needles:   Procedures:,,,, ultrasound used (permanent image in chart),,,,  Narrative:  Start time: 12/14/2017 7:35 AM End time: 12/14/2017 7:38 AM Injection made incrementally with aspirations every 5 mL.  Performed by: Personally  Anesthesiologist: Martha Clan, MD  Additional Notes: Functioning IV was confirmed and monitors were applied.  A 71mm 22ga Stimuplex needle was used. Sterile prep and drape,hand hygiene and sterile gloves were used.  Negative aspiration and negative test dose prior to incremental administration of local anesthetic. The patient tolerated the procedure well.

## 2017-12-14 NOTE — Anesthesia Preprocedure Evaluation (Signed)
Anesthesia Evaluation  Patient identified by MRN, date of birth, ID band Patient awake    Reviewed: Allergy & Precautions, H&P , NPO status , Patient's Chart, lab work & pertinent test results, reviewed documented beta blocker date and time   History of Anesthesia Complications Negative for: history of anesthetic complications  Airway Mallampati: I  TM Distance: >3 FB Neck ROM: full    Dental  (+) Missing, Dental Advidsory Given   Pulmonary neg shortness of breath, asthma , neg sleep apnea, neg COPD, neg recent URI, Current Smoker,           Cardiovascular Exercise Tolerance: Good negative cardio ROS       Neuro/Psych  Headaches, neg Seizures negative psych ROS   GI/Hepatic negative GI ROS, Neg liver ROS,   Endo/Other  negative endocrine ROS  Renal/GU negative Renal ROS  negative genitourinary   Musculoskeletal   Abdominal   Peds  Hematology negative hematology ROS (+)   Anesthesia Other Findings Past Medical History: No date: Asthma No date: Bulging lumbar disc No date: Headache     Comment:  MIGRAINES No date: Osteoporosis   Reproductive/Obstetrics negative OB ROS                             Anesthesia Physical Anesthesia Plan  ASA: II  Anesthesia Plan: General   Post-op Pain Management:  Regional for Post-op pain   Induction: Intravenous  PONV Risk Score and Plan: 2 and Ondansetron and Dexamethasone  Airway Management Planned: Oral ETT  Additional Equipment:   Intra-op Plan:   Post-operative Plan: Extubation in OR  Informed Consent: I have reviewed the patients History and Physical, chart, labs and discussed the procedure including the risks, benefits and alternatives for the proposed anesthesia with the patient or authorized representative who has indicated his/her understanding and acceptance.   Dental Advisory Given  Plan Discussed with: Anesthesiologist, CRNA  and Surgeon  Anesthesia Plan Comments:         Anesthesia Quick Evaluation

## 2017-12-14 NOTE — Transfer of Care (Signed)
Immediate Anesthesia Transfer of Care Note  Patient: Kristi Carlson  Procedure(s) Performed: SHOULDER ARTHROSCOPY WITH CAPSULAR RELEASE, RELEASE OF ADHEISIONS SUBACROMIAL BURSECTOMY AND MANIPULATION UNDER ANESTHESAI (Left Shoulder)  Patient Location: PACU  Anesthesia Type:General  Level of Consciousness: awake and alert   Airway & Oxygen Therapy: Patient Spontanous Breathing  Post-op Assessment: Report given to RN  Post vital signs: Reviewed and stable  Last Vitals:  Vitals:   12/14/17 1122 12/14/17 1123  BP:    Pulse:  89  Resp: 19 20  Temp: (!) 36.4 C   SpO2: 97%     Last Pain:  Vitals:   12/14/17 0739  TempSrc:   PainSc: 5          Complications: No apparent anesthesia complications

## 2017-12-14 NOTE — H&P (Signed)
Paper H&P to be scanned into permanent record. H&P reviewed. No significant changes noted.  

## 2017-12-14 NOTE — Anesthesia Procedure Notes (Signed)
Procedure Name: Intubation Performed by: Philbert Riser, CRNA Pre-anesthesia Checklist: Patient identified Patient Re-evaluated:Patient Re-evaluated prior to induction Oxygen Delivery Method: Circle system utilized Preoxygenation: Pre-oxygenation with 100% oxygen Induction Type: IV induction Ventilation: Mask ventilation without difficulty Laryngoscope Size: Mac and 3 Grade View: Grade I Tube type: Oral Endobronchial tube: Right and EBT position confirmed by auscultation Number of attempts: 1 Secured at: 20 cm Tube secured with: Tape Dental Injury: Teeth and Oropharynx as per pre-operative assessment

## 2017-12-14 NOTE — Op Note (Signed)
OPERATIVE NOTE 12/14/2017  PRE-OP DIAGNOSIS: 1. Left shoulder adhesive capsulitis  2. Left subacromial bursitis, impingement    POST-OP DIAGNOSIS:  1. Left shoulder adhesive capsulitis  2. Left subacromial bursitis, impingement  PROCEDURES: 1. Left shoulder capsular releases, lysis of adhesions, manipulation under anesthesia  2. Left subacromial decompression without acromioplasty  3. Left glenohumeral and subacromial injections with corticosteroid  SURGEON:  Cato Mulligan, MD  ASSISTANT(S):  none  ANESTHESIA: Regional block with Exparil, Gen  TOTAL IV FLUIDS: per anesthesia record   ESTIMATED BLOOD LOSS: Minimal  DRAINS:  None.  SPECIMENS: None  IMPLANTS: None.  COMPLICATIONS: none  INDICATIONS: Kristi Carlson is a 47 y.o. female with complaints of left shoulder pain and stiffness. Preoperative left shoulder examination was notable for severe motion loss and pain. She failed two rounds of corticosteroid injections and extensive physical therapy.  Surgery was recommended for capsular releases, manipulation under anesthesia, and subacromial decompression/bursectomy with corticosteroid injections into glenohumeral joint and subacromial space. After discussion of risks, benefits, and alternatives to surgery, the patient elected to proceed.  She also had numbness and radiating pain in ulnar distribution as well as trapezius pain with unknown etiology after workup. We did discuss that the goal of this procedure was to improve motion and shoulder pain.    DETAILS OF PROCEDURE: After satisfactory left upper extremity regional block with Exparil and sedation were achieved in the preoperative holding area, the patient was brought to the operating room and placed in a well-padded beach chair positioner.  Eyes were protected, head was affixed in neutral, and the patient was given preoperative IV antibiotics within 30 minutes of the start of the case and a surgical time-out occurred. The  upper extremity was prescrubbed with Hibiclens and alcohol, prepped with ChloraPrep and draped in the usual sterile fashion.    Pre-op Range of Motion   Right Left   Flexion  170 130  Abduction  150 100  ER at 0  85 50  ER at 90  130 90  IR at 90  50 30  IR posterior  T4 T12   Typical portals were marked and injected with a dilute solution of lidocaine and epinephrine.  An 11-blade was used to make portals.    The arthroscope was placed in the glenohumeral joint from posterior approach and an anterior portal was established. Posterior and inferior labrum were normal. There was some redundancy of the superior labrum. There was a suture in the proximal/anterior labrum that was loose so it was removed. . Articular cartilage of the humeral head and glenoid was essentially normal with a few areas of fraying/thinning.   The biceps tendon was not present due to her prior biceps tenodesis. Rotator cuff findings: Normal subscapularis and infranspinatus. There was ~1-54mm of articular sided tearing of the supraspinatus. Frayed portions were gently debrided.  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 This required synovectomy with a shaver and Arthrocare device in the affected compartments listed above.  The capsule was thinned with a shaver back to a normal thickness.  An upbiting duckbill basket was then used to perform a capsulotomy of the rotator interval and then the MGHL and the IGHL (anterior band).  Care was taken to protect the intraarticular subscapularis.  Adhesions were cleared off the subscapularis to allow full internal and external rotation.    The arthroscope was placed into the anterior portal.  The posterior capsule was quite thickened  and inflamed as well.  After synovectomy, the duckbill basket was used to perform release from the superior glenoid, down into the axillary pouch, around to the anterior  band of the IGHL.  A complete 360 capsulotomy was performed in this manner.  Care was taken to protect the axillary nerve by staying on the glenoid side and making sure not to rotate the shoulder externally during the capsulotomy.  There was no unusual bleeding.  The instruments were removed from the joint.    Manipulation under anesthesia was carried out.  The following ranges of motion were achieved:  Forward flexion 170 degrees, abduction 150 degrees, external rotation at 0 was 85 degrees, external rotation at 90 degrees abduction was 130 degrees, internal rotation at 90 degrees abduction was 50 degrees, internal rotation behind the back was to T6. Fluoroscopy was used to confirm that there was no fracture after manipulation.  The arthroscope was placed in the subacromial space. An accessory lateral portal was established. There was severe scar and chronic bursitis filling the whole subacromial space and gutters.  A complete subacromial bursectomy and debridement of the gutters was carried out with a shaver.  ArthroCare was used to control bleeding. The CA ligament was normal. It required no management.  That completed the case.  The skin was closed with interrupted 3-0 nylon sutures.  Injections of 40 mg Kenalog with 1% lidocaine and 0.5% ropivacaine were placed separately in the glenohumeral joint and subacromial space with a spinal needle.  Xeroform gauze, sterile dressings were applied.   The patient was placed in a shoulder sling.  Polar Care was applied.    Instrument, sponge, and needle counts were correct prior to closure and at the conclusion of the case.   DISPOSITION: PACU - hemodynamically stable.   POSTOPERATIVE PLAN: The patient will be discharged home. PT to begin 1 day postop for range of motion exercises.  RTC in ~10 days. ASA for DVT ppx x 2 weeks.

## 2017-12-14 NOTE — Anesthesia Post-op Follow-up Note (Signed)
Anesthesia QCDR form completed.        

## 2017-12-14 NOTE — Discharge Instructions (Signed)
Post-Op Instructions - Rotator Cuff Repair  1. Bracing: You should wear a sling for comfort only. Sling should NOT be worn longer than ~1 week.   2. Driving: No driving for 2 weeks post-op. Must be off narcotic pain medication.  3. Activity: No active lifting for ~2 weeks. Perform range of motion exercises DAILY at home and with physical therapy as prescribed.   4. Physical Therapy: This NEEDS to begin the day after surgery, and proceed ~6-12 weeks. This should be at least 3x/week.   5. Medications:  - You will be provided a prescription for narcotic pain medicine. After surgery, take 1-2 narcotic tablets every 4 hours if needed for severe pain.  - A prescription for anti-nausea medication will be provided in case the narcotic medicine causes nausea - take 1 tablet every 6 hours only if nauseated.   - Take tylenol 1000 mg (2 Extra Strength tablets or 3 regular strength) every 8 hours for pain.  May decrease or stop tylenol 5 days after surgery if you are having minimal pain. - Take ibuprofen 800mg  three times/day with food for at least two weeks every day. This will help reduce post-operative inflammation and swelling. Please call our offices if this causes any stomach/GI irritation.    If you are taking prescription medication for anxiety, depression, insomnia, muscle spasm, chronic pain, or for attention deficit disorder, you are advised that you are at a higher risk of adverse effects with use of narcotics post-op, including narcotic addiction/dependence, depressed breathing, death. If you use non-prescribed substances: alcohol, marijuana, cocaine, heroin, methamphetamines, etc., you are at a higher risk of adverse effects with use of narcotics post-op, including narcotic addiction/dependence, depressed breathing, death. You are advised that taking > 50 morphine milligram equivalents (MME) of narcotic pain medication per day results in twice the risk of overdose or death. For your prescription  provided: oxycodone 5 mg - taking more than 6 tablets per day would result in > 50 morphine milligram equivalents (MME) of narcotic pain medication. Be advised that we will prescribe narcotics short-term, for acute post-operative pain only - 3 weeks for major operations such as shoulder repair/reconstruction surgeries.    6. Post-Op Appointment:  Your first post-op appointment will be ~1 week post-op.  7. Work or School: For most, but not all procedures, we advise staying out of work or school for at least 1 to 2 weeks in order to recover from the stress of surgery and to allow time for healing.   If you need a work or school note this can be provided.

## 2017-12-15 ENCOUNTER — Encounter: Payer: Self-pay | Admitting: Orthopedic Surgery

## 2017-12-15 NOTE — Anesthesia Postprocedure Evaluation (Signed)
Anesthesia Post Note  Patient: Kristi Carlson  Procedure(s) Performed: SHOULDER ARTHROSCOPY WITH CAPSULAR RELEASE, RELEASE OF ADHEISIONS SUBACROMIAL BURSECTOMY AND MANIPULATION UNDER ANESTHESAI (Left Shoulder)  Patient location during evaluation: PACU Anesthesia Type: General and Regional Level of consciousness: awake and alert Pain management: pain level controlled Vital Signs Assessment: post-procedure vital signs reviewed and stable Respiratory status: spontaneous breathing, nonlabored ventilation, respiratory function stable and patient connected to nasal cannula oxygen Cardiovascular status: blood pressure returned to baseline and stable Postop Assessment: no apparent nausea or vomiting Anesthetic complications: no     Last Vitals:  Vitals:   12/14/17 1221 12/14/17 1253  BP: 99/63 100/66  Pulse: 76 72  Resp: 15   Temp: (!) 36.2 C   SpO2: 100% 100%    Last Pain:  Vitals:   12/14/17 1221  TempSrc:   PainSc: 0-No pain                 Martha Clan

## 2018-03-18 NOTE — H&P (Signed)
Patient ID: Kristi Carlson is a 47 y.o. female presenting with Pre Op Consulting  on 03/18/2018  HPI: [phone] [id number] with Worsening menometrorrhagia now with dysmenorrhea and dyspareunia, worse over last several months and pain for the last week. +fatigue, labs normal (TSH, CBC)  Also dyspareunia with right sided ovarian pain, some vaginal tenderness, and uterine pain with intercourse  Workup:  Pap: collected today EMBx: WEAKLY PROLIFERATIVE ENDOMETRIUM, AND POLYPOID FRAGMENTS OF PROLIFERATIVE  TYPE ENDOMETRIUM SUGGESTIVE OF BENIGN ENDOMETRIAL POLYP, MIXED WITH MINUTE  FRAGMENTS OF BENIGN ENDOCERVICAL GLANDS, MUCUS, AND BLOOD. NO HYPERPLASIA  OR CARCINOMA.   TVUS:  Ut anteverted, 8x4x5cm  Fibroids seen: 1) Rt lat=0.82cm 2)post=1.8cm 3)rt post=1.8cm 4)ant=1.2cm  Endo=6.26mm  No FF seen in CDS's  LOV simple cyst=3.5cm  Doppler waveforms performed on LOV  ROV appears wnl  Hx of BTL Hx of NSVD x3  Past Medical History:  has a past medical history of Abnormal uterine bleeding, unspecified, Asthma without status asthmaticus, unspecified, Bulging of cervical intervertebral disc, Bulging of thoracic intervertebral disc, Chronic left shoulder pain, Diverticulosis (03/05/2016), Internal hemorrhoids (03/05/2016), Migraine with acute onset aura, and Tobacco consumption (11/01/2015).  Past Surgical History:  has a past surgical history that includes Tubal ligation (12/2009); Shoulder surgery Other] (Left); Colonoscopy (2006); and Colonoscopy (03/05/2016). Family History: family history includes Alzheimer's disease in her maternal grandfather; Coronary Artery Disease (Blocked arteries around heart) in her paternal grandmother; Depression in her mother; Diabetes type II in her paternal grandmother; High blood pressure (Hypertension) in her paternal grandmother; Hyperlipidemia (Elevated cholesterol) in her paternal grandmother; No Known Problems in her brother, brother, daughter,  daughter, sister, and son. Social History:  reports that she has been smoking.  She has a 2.50 pack-year smoking history. She has never used smokeless tobacco. She reports that she drinks alcohol. She reports that she does not use drugs. OB/GYN History:          OB History    Gravida  4   Para  3   Term  3   Preterm      AB  1   Living  3     SAB  1   TAB      Ectopic      Molar      Multiple      Live Births             Allergies: is allergic to other. Medications:  Current Outpatient Medications:  .  albuterol (VENTOLIN HFA) 90 mcg/actuation inhaler, 2 puffs inhaled q 6hours prn - shortness of breath.Pharmacist instruct use with spacer, Disp: 1 Inhaler, Rfl: 1 .  baclofen (LIORESAL) 20 MG tablet, Take 20 mg by mouth 3 (three) times daily, Disp: , Rfl:  .  dextromethorphan-guaifenesin (MUCINEX DM) 30-600 mg ER tablet, Take 1 tablet by mouth every 12 (twelve) hours, Disp: , Rfl:  .  fluticasone propionate (FLOVENT HFA) 110 mcg/actuation inhaler, Inhale 2 inhalations into the lungs 2 (two) times daily, Disp: 1 Inhaler, Rfl: 1 .  gabapentin (NEURONTIN) 100 MG capsule, TAKE ONE TO TWO CAPSULES BY MOUTH THREE TIMES DAILY, Disp: 180 capsule, Rfl: 3 .  linaclotide (LINZESS) 290 mcg capsule, Take 1 capsule (290 mcg total) by mouth once daily., Disp: 90 capsule, Rfl: 3 .  SUMAtriptan (IMITREX) 25 MG tablet, Take 25 mg by mouth once as needed.  , Disp: , Rfl:  .  UNABLE TO FIND, Take 3 capsules by mouth nightly as needed. Restful leg (Hyland's), Disp: , Rfl:  Review of Systems: No SOB, no palpitations or chest pain, no new lower extremity edema, no nausea or vomiting or bowel or bladder complaints. See HPI for gyn specific ROS.   Exam:   BP 112/80   Ht 165.1 cm (5\' 5" )   Wt 57.2 kg (126 lb)   LMP 02/22/2018 (Exact Date)   BMI 20.97 kg/m   General: Patient is well-groomed, well-nourished, appears stated age in no acute distress  HEENT: head is  atraumatic and normocephalic, trachea is midline, neck is supple with no palpable nodules  CV: Regular rhythm and normal heart rate, no murmur  Pulm: Clear to auscultation throughout lung fields with no wheezing, crackles, or rhonchi. No increased work of breathing  Abdomen: soft , no mass, non-tender, no rebound tenderness, no hepatomegaly  Pelvic:  deferred   Impression:   The encounter diagnosis was Preop examination.    Plan:    Patient returns for a discussion regarding her plans to proceed with surgical treatment of her pelvic pain and AUB by total laparoscopic hysterectomy with bilateral salpingectomy and right oopherectomy procedure. We will perform a cystoscopy to evaluate the urinary tract after the procedure.   I recommend pelvic floor physical therapy after surgery  The patient and I discussed the technical aspects of the procedure including the potential for risks and complications. These include but are not limited to the risk of infection requiring post-operative antibiotics or further procedures. We talked about the risk of injury to adjacent organs including bladder, bowel, ureter, blood vessels or nerves. We talked about the need to convert to an open incision. We talked about the possible need for blood transfusion. We talked aboutpostop complications such asthromboembolic or cardiopulmonary complications. All of her questions were answered.  Her preoperative exam was completed and the appropriate consents were signed. She is scheduled to undergo this procedure in the near future.  Specific Peri-operative Considerations:  - Consent: obtained today - Health Maintenance: up to date - Labs: CBC, CMP preoperatively - Studies: EKG, CXR preoperatively - Bowel Preparation: None required - Abx:  Cefoxitin 2g - VTE ppx: SCDs perioperatively - Glucose Protocol: no - Beta-blockade: no    Return for Postop check.  Sherrie George, MD

## 2018-03-26 ENCOUNTER — Other Ambulatory Visit: Payer: Self-pay

## 2018-03-26 ENCOUNTER — Encounter
Admission: RE | Admit: 2018-03-26 | Discharge: 2018-03-26 | Disposition: A | Discharge status: Home / Self Care | Payer: Federal, State, Local not specified - PPO | Source: Ambulatory Visit | Attending: Obstetrics and Gynecology | Admitting: Obstetrics and Gynecology

## 2018-03-26 DIAGNOSIS — N941 Unspecified dyspareunia: Secondary | ICD-10-CM | POA: Insufficient documentation

## 2018-03-26 DIAGNOSIS — N946 Dysmenorrhea, unspecified: Secondary | ICD-10-CM | POA: Diagnosis not present

## 2018-03-26 DIAGNOSIS — Z9851 Tubal ligation status: Secondary | ICD-10-CM | POA: Diagnosis not present

## 2018-03-26 DIAGNOSIS — J45909 Unspecified asthma, uncomplicated: Secondary | ICD-10-CM | POA: Insufficient documentation

## 2018-03-26 DIAGNOSIS — Z79899 Other long term (current) drug therapy: Secondary | ICD-10-CM | POA: Diagnosis not present

## 2018-03-26 DIAGNOSIS — Z0183 Encounter for blood typing: Secondary | ICD-10-CM | POA: Diagnosis not present

## 2018-03-26 DIAGNOSIS — Z7951 Long term (current) use of inhaled steroids: Secondary | ICD-10-CM | POA: Insufficient documentation

## 2018-03-26 DIAGNOSIS — Z01812 Encounter for preprocedural laboratory examination: Secondary | ICD-10-CM | POA: Diagnosis not present

## 2018-03-26 DIAGNOSIS — F17210 Nicotine dependence, cigarettes, uncomplicated: Secondary | ICD-10-CM | POA: Diagnosis not present

## 2018-03-26 DIAGNOSIS — N921 Excessive and frequent menstruation with irregular cycle: Secondary | ICD-10-CM | POA: Diagnosis not present

## 2018-03-26 LAB — TYPE AND SCREEN
ABO/RH(D): O POS
Antibody Screen: NEGATIVE

## 2018-03-26 LAB — CBC
HEMATOCRIT: 41.9 % (ref 35.0–47.0)
Hemoglobin: 14.5 g/dL (ref 12.0–16.0)
MCH: 33.3 pg (ref 26.0–34.0)
MCHC: 34.5 g/dL (ref 32.0–36.0)
MCV: 96.3 fL (ref 80.0–100.0)
Platelets: 173 10*3/uL (ref 150–440)
RBC: 4.35 MIL/uL (ref 3.80–5.20)
RDW: 13.7 % (ref 11.5–14.5)
WBC: 7.1 10*3/uL (ref 3.6–11.0)

## 2018-03-26 LAB — BASIC METABOLIC PANEL
Anion gap: 6 (ref 5–15)
BUN: 15 mg/dL (ref 6–20)
CHLORIDE: 108 mmol/L (ref 101–111)
CO2: 25 mmol/L (ref 22–32)
Calcium: 9 mg/dL (ref 8.9–10.3)
Creatinine, Ser: 0.75 mg/dL (ref 0.44–1.00)
GFR calc Af Amer: >60 mL/min (ref >60)
GFR calc non Af Amer: >60 mL/min (ref >60)
Glucose, Bld: 95 mg/dL (ref 65–99)
POTASSIUM: 4.1 mmol/L (ref 3.5–5.1)
SODIUM: 139 mmol/L (ref 135–145)

## 2018-03-26 NOTE — Patient Instructions (Signed)
Your procedure is scheduled on: Apr 02, 2018 Friday  Report to Day Surgery on the 2nd floor of the [facility]. To find out your arrival time, please call [phone] between 1PM - 3PM on: Apr 01, 2018 THURSDAY  REMEMBER: Instructions that are not followed completely may result in serious medical risk, up to and including death; or upon the discretion of your surgeon and anesthesiologist your surgery may need to be rescheduled.  Do not eat food after midnight the night before your procedure.  No gum chewing, lozengers or hard candies.  You may however, drink CLEAR liquids up to 2 hours before you are scheduled to arrive for your surgery. Do not drink anything within 2 hours of the start of your surgery.  Clear liquids include: - water  - apple juice without pulp - clear gatorade - black coffee or tea (Do NOT add anything to the coffee or tea) Do NOT drink anything that is not on this list.  Type 1 and Type 2 diabetics should only drink water.  No Alcohol for 24 hours before or after surgery.  No Smoking including e-cigarettes for 24 hours prior to surgery.  No chewable tobacco products for at least 6 hours prior to surgery.  No nicotine patches on the day of surgery.  On the morning of surgery brush your teeth with toothpaste and water, you may rinse your mouth with mouthwash if you wish. Do not swallow any toothpaste or mouthwash.  Notify your doctor if there is any change in your medical condition (cold, fever, infection).  Do not wear jewelry, make-up, hairpins, clips or nail polish.  Do not wear lotions, powders, or perfumes. You may NOTwear deodorant.  Do not shave 48 hours prior to surgery. Men may shave face and neck.  Contacts and dentures may not be worn into surgery.  Do not bring valuables to the hospital, including drivers license, insurance or credit cards.  [facility] is not responsible for any belongings or valuables.   TAKE THESE MEDICATIONS THE MORNING  OF SURGERY: LINZESS   Use CHG Soap  as directed on instruction sheet.  Use inhalers on the day of surgery   Stop Anti-inflammatories (NSAIDS) such as MELOXICAM Advil, Aleve, Ibuprofen, Motrin, Naproxen, Naprosyn and Aspirin based products such as Excedrin, Goodys Powder, BC Powder. (May take Tylenol or Acetaminophen if needed.)  Stop ANY OVER THE COUNTER supplements until after surgery. (May continue Vitamin D, Vitamin B, and multivitamin.)  Wear comfortable clothing (specific to your surgery type) to the hospital.  Plan for stool softeners for home use.  If you are being discharged the day of surgery, you will not be allowed to drive home. You will need a responsible adult to drive you home and stay with you that night.   If you are taking public transportation, you will need to have a responsible adult with you. Please confirm with your physician that it is acceptable to use public transportation.   Please call [phone] if you have any questions about these instructions.

## 2018-04-01 MED ORDER — CEFAZOLIN SODIUM-DEXTROSE 2-4 GM/100ML-% IV SOLN
2.0000 g | INTRAVENOUS | Status: AC
  Administered 2018-04-02: 2 g via INTRAVENOUS

## 2018-04-02 ENCOUNTER — Encounter: Payer: Self-pay | Admitting: *Deleted

## 2018-04-02 ENCOUNTER — Encounter
Admission: RE | Disposition: A | Discharge status: Home / Self Care | Payer: Self-pay | Source: Ambulatory Visit | Attending: Obstetrics and Gynecology

## 2018-04-02 ENCOUNTER — Other Ambulatory Visit: Payer: Self-pay

## 2018-04-02 ENCOUNTER — Ambulatory Visit: Payer: Federal, State, Local not specified - PPO | Admitting: Anesthesiology

## 2018-04-02 ENCOUNTER — Ambulatory Visit
Admission: RE | Admit: 2018-04-02 | Discharge: 2018-04-02 | Disposition: A | Discharge status: Home / Self Care | Payer: Federal, State, Local not specified - PPO | Source: Ambulatory Visit | Attending: Obstetrics and Gynecology | Admitting: Obstetrics and Gynecology

## 2018-04-02 DIAGNOSIS — N8301 Follicular cyst of right ovary: Secondary | ICD-10-CM | POA: Insufficient documentation

## 2018-04-02 DIAGNOSIS — Z8249 Family history of ischemic heart disease and other diseases of the circulatory system: Secondary | ICD-10-CM | POA: Diagnosis not present

## 2018-04-02 DIAGNOSIS — N800 Endometriosis of uterus: Secondary | ICD-10-CM | POA: Insufficient documentation

## 2018-04-02 DIAGNOSIS — K5790 Diverticulosis of intestine, part unspecified, without perforation or abscess without bleeding: Secondary | ICD-10-CM | POA: Diagnosis not present

## 2018-04-02 DIAGNOSIS — J45909 Unspecified asthma, uncomplicated: Secondary | ICD-10-CM | POA: Diagnosis not present

## 2018-04-02 DIAGNOSIS — D259 Leiomyoma of uterus, unspecified: Secondary | ICD-10-CM | POA: Insufficient documentation

## 2018-04-02 DIAGNOSIS — Z79899 Other long term (current) drug therapy: Secondary | ICD-10-CM | POA: Diagnosis not present

## 2018-04-02 DIAGNOSIS — G43909 Migraine, unspecified, not intractable, without status migrainosus: Secondary | ICD-10-CM | POA: Diagnosis not present

## 2018-04-02 DIAGNOSIS — Z7951 Long term (current) use of inhaled steroids: Secondary | ICD-10-CM | POA: Diagnosis not present

## 2018-04-02 DIAGNOSIS — G8929 Other chronic pain: Secondary | ICD-10-CM | POA: Insufficient documentation

## 2018-04-02 DIAGNOSIS — Z823 Family history of stroke: Secondary | ICD-10-CM | POA: Diagnosis not present

## 2018-04-02 DIAGNOSIS — Z833 Family history of diabetes mellitus: Secondary | ICD-10-CM | POA: Insufficient documentation

## 2018-04-02 DIAGNOSIS — Z818 Family history of other mental and behavioral disorders: Secondary | ICD-10-CM | POA: Diagnosis not present

## 2018-04-02 DIAGNOSIS — N888 Other specified noninflammatory disorders of cervix uteri: Secondary | ICD-10-CM | POA: Diagnosis present

## 2018-04-02 DIAGNOSIS — N941 Unspecified dyspareunia: Secondary | ICD-10-CM | POA: Diagnosis not present

## 2018-04-02 DIAGNOSIS — N921 Excessive and frequent menstruation with irregular cycle: Secondary | ICD-10-CM | POA: Insufficient documentation

## 2018-04-02 DIAGNOSIS — M25512 Pain in left shoulder: Secondary | ICD-10-CM | POA: Insufficient documentation

## 2018-04-02 DIAGNOSIS — Z820 Family history of epilepsy and other diseases of the nervous system: Secondary | ICD-10-CM | POA: Insufficient documentation

## 2018-04-02 DIAGNOSIS — K649 Unspecified hemorrhoids: Secondary | ICD-10-CM | POA: Insufficient documentation

## 2018-04-02 DIAGNOSIS — F17200 Nicotine dependence, unspecified, uncomplicated: Secondary | ICD-10-CM | POA: Diagnosis not present

## 2018-04-02 DIAGNOSIS — N946 Dysmenorrhea, unspecified: Secondary | ICD-10-CM | POA: Diagnosis not present

## 2018-04-02 HISTORY — PX: LAPAROSCOPIC BILATERAL SALPINGECTOMY: SHX5889

## 2018-04-02 HISTORY — PX: LAPAROSCOPIC HYSTERECTOMY: SHX1926

## 2018-04-02 LAB — POCT PREGNANCY, URINE: Preg Test, Ur: NEGATIVE

## 2018-04-02 LAB — ABO/RH: ABO/RH(D): O POS

## 2018-04-02 SURGERY — HYSTERECTOMY, TOTAL, LAPAROSCOPIC
Anesthesia: General | Laterality: Right

## 2018-04-02 MED ORDER — ONDANSETRON HCL 4 MG/2ML IJ SOLN
INTRAMUSCULAR | Status: AC
  Filled 2018-04-02: qty 2

## 2018-04-02 MED ORDER — ACETAMINOPHEN 10 MG/ML IV SOLN
INTRAVENOUS | Status: AC
  Filled 2018-04-02: qty 100

## 2018-04-02 MED ORDER — ROCURONIUM BROMIDE 100 MG/10ML IV SOLN
INTRAVENOUS | Status: DC | PRN
  Administered 2018-04-02: 10 mg via INTRAVENOUS
  Administered 2018-04-02: 40 mg via INTRAVENOUS

## 2018-04-02 MED ORDER — METHYLENE BLUE 0.5 % INJ SOLN
INTRAVENOUS | Status: AC
  Filled 2018-04-02: qty 10

## 2018-04-02 MED ORDER — FENTANYL CITRATE (PF) 100 MCG/2ML IJ SOLN
25.0000 ug | INTRAMUSCULAR | Status: AC | PRN
  Administered 2018-04-02 (×6): 25 ug via INTRAVENOUS

## 2018-04-02 MED ORDER — LACTATED RINGERS IV SOLN
INTRAVENOUS | Status: DC
  Administered 2018-04-02: 07:00:00 via INTRAVENOUS

## 2018-04-02 MED ORDER — SUGAMMADEX SODIUM 500 MG/5ML IV SOLN
INTRAVENOUS | Status: DC | PRN
  Administered 2018-04-02: 108.8 mg via INTRAVENOUS

## 2018-04-02 MED ORDER — ACETAMINOPHEN 500 MG PO TABS
1000.0000 mg | ORAL_TABLET | ORAL | Status: AC
  Administered 2018-04-02: 1000 mg via ORAL

## 2018-04-02 MED ORDER — DOCUSATE SODIUM 100 MG PO CAPS: 100.0000 mg | ORAL_CAPSULE | Freq: Two times a day (BID) | ORAL | 0 refills | Status: AC

## 2018-04-02 MED ORDER — IBUPROFEN 800 MG PO TABS: 800.0000 mg | ORAL_TABLET | Freq: Three times a day (TID) | ORAL | 1 refills | Status: AC | PRN

## 2018-04-02 MED ORDER — GABAPENTIN 300 MG PO CAPS
ORAL_CAPSULE | ORAL | Status: AC
  Administered 2018-04-02: 300 mg via ORAL
  Filled 2018-04-02: qty 1

## 2018-04-02 MED ORDER — ACETAMINOPHEN 500 MG PO TABS: 1000.0000 mg | ORAL_TABLET | Freq: Four times a day (QID) | ORAL | 0 refills | Status: AC

## 2018-04-02 MED ORDER — ONDANSETRON HCL 4 MG/2ML IJ SOLN
INTRAMUSCULAR | Status: DC | PRN
  Administered 2018-04-02: 4 mg via INTRAVENOUS

## 2018-04-02 MED ORDER — OXYCODONE HCL 5 MG PO CAPS: 5.0000 mg | ORAL_CAPSULE | Freq: Four times a day (QID) | ORAL | 0 refills | Status: DC | PRN

## 2018-04-02 MED ORDER — MIDAZOLAM HCL 2 MG/2ML IJ SOLN
INTRAMUSCULAR | Status: AC
  Filled 2018-04-02: qty 2

## 2018-04-02 MED ORDER — FENTANYL CITRATE (PF) 100 MCG/2ML IJ SOLN
INTRAMUSCULAR | Status: AC
  Filled 2018-04-02: qty 2

## 2018-04-02 MED ORDER — FAMOTIDINE 20 MG PO TABS
ORAL_TABLET | ORAL | Status: AC
  Administered 2018-04-02: 20 mg via ORAL
  Filled 2018-04-02: qty 1

## 2018-04-02 MED ORDER — ROCURONIUM BROMIDE 50 MG/5ML IV SOLN
INTRAVENOUS | Status: AC
  Filled 2018-04-02: qty 1

## 2018-04-02 MED ORDER — OXYCODONE HCL 5 MG PO TABS
ORAL_TABLET | ORAL | Status: AC
  Filled 2018-04-02: qty 1

## 2018-04-02 MED ORDER — GABAPENTIN 800 MG PO TABS: 800.0000 mg | ORAL_TABLET | Freq: Every day | ORAL | 0 refills | Status: AC

## 2018-04-02 MED ORDER — OXYCODONE HCL 5 MG PO TABS
5.0000 mg | ORAL_TABLET | Freq: Four times a day (QID) | ORAL | Status: DC | PRN
Start: 2018-04-02 — End: 2018-04-02
  Administered 2018-04-02: 5 mg via ORAL
  Filled 2018-04-02: qty 1

## 2018-04-02 MED ORDER — FAMOTIDINE 20 MG PO TABS
20.0000 mg | ORAL_TABLET | Freq: Once | ORAL | Status: AC
  Administered 2018-04-02: 20 mg via ORAL

## 2018-04-02 MED ORDER — LIDOCAINE HCL (PF) 2 % IJ SOLN
INTRAMUSCULAR | Status: AC
  Filled 2018-04-02: qty 10

## 2018-04-02 MED ORDER — GABAPENTIN 300 MG PO CAPS
300.0000 mg | ORAL_CAPSULE | ORAL | Status: AC
  Administered 2018-04-02: 300 mg via ORAL

## 2018-04-02 MED ORDER — KETOROLAC TROMETHAMINE 30 MG/ML IJ SOLN
INTRAMUSCULAR | Status: DC | PRN
  Administered 2018-04-02: 30 mg via INTRAVENOUS

## 2018-04-02 MED ORDER — DEXAMETHASONE SODIUM PHOSPHATE 10 MG/ML IJ SOLN
INTRAMUSCULAR | Status: DC | PRN
  Administered 2018-04-02: 5 mg via INTRAVENOUS

## 2018-04-02 MED ORDER — FENTANYL CITRATE (PF) 100 MCG/2ML IJ SOLN
INTRAMUSCULAR | Status: AC
  Administered 2018-04-02: 25 ug via INTRAVENOUS
  Filled 2018-04-02: qty 2

## 2018-04-02 MED ORDER — BUPIVACAINE HCL (PF) 0.5 % IJ SOLN
INTRAMUSCULAR | Status: AC
  Filled 2018-04-02: qty 30

## 2018-04-02 MED ORDER — DEXAMETHASONE SODIUM PHOSPHATE 10 MG/ML IJ SOLN
INTRAMUSCULAR | Status: AC
  Filled 2018-04-02: qty 1

## 2018-04-02 MED ORDER — CEFAZOLIN SODIUM-DEXTROSE 2-4 GM/100ML-% IV SOLN
INTRAVENOUS | Status: AC
  Filled 2018-04-02: qty 100

## 2018-04-02 MED ORDER — LIDOCAINE HCL (CARDIAC) PF 100 MG/5ML IV SOSY
PREFILLED_SYRINGE | INTRAVENOUS | Status: DC | PRN
  Administered 2018-04-02: 80 mg via INTRAVENOUS

## 2018-04-02 MED ORDER — MIDAZOLAM HCL 2 MG/2ML IJ SOLN
INTRAMUSCULAR | Status: DC | PRN
  Administered 2018-04-02: 2 mg via INTRAVENOUS

## 2018-04-02 MED ORDER — PROPOFOL 10 MG/ML IV BOLUS
INTRAVENOUS | Status: DC | PRN
  Administered 2018-04-02: 110 mg via INTRAVENOUS

## 2018-04-02 MED ORDER — ONDANSETRON HCL 4 MG/2ML IJ SOLN: 4.0000 mg | Freq: Once | INTRAMUSCULAR | Status: DC | PRN

## 2018-04-02 MED ORDER — PROPOFOL 10 MG/ML IV BOLUS
INTRAVENOUS | Status: AC
  Filled 2018-04-02: qty 20

## 2018-04-02 MED ORDER — FENTANYL CITRATE (PF) 100 MCG/2ML IJ SOLN
INTRAMUSCULAR | Status: DC | PRN
  Administered 2018-04-02 (×2): 25 ug via INTRAVENOUS
  Administered 2018-04-02: 50 ug via INTRAVENOUS

## 2018-04-02 MED ORDER — BUPIVACAINE HCL 0.5 % IJ SOLN
INTRAMUSCULAR | Status: DC | PRN
  Administered 2018-04-02: 14 mL

## 2018-04-02 MED ORDER — ACETAMINOPHEN 500 MG PO TABS
ORAL_TABLET | ORAL | Status: AC
  Administered 2018-04-02: 1000 mg via ORAL
  Filled 2018-04-02: qty 2

## 2018-04-02 SURGICAL SUPPLY — 64 items
APPLICATOR ARISTA FLEXITIP XL (MISCELLANEOUS) ×5 IMPLANT
BAG URINE DRAINAGE (UROLOGICAL SUPPLIES) ×5 IMPLANT
BLADE SURG SZ11 CARB STEEL (BLADE) ×5 IMPLANT
CATH FOLEY 2WAY  5CC 16FR (CATHETERS) ×2
CATH ROBINSON RED A/P 16FR (CATHETERS) ×5 IMPLANT
CATH URTH 16FR FL 2W BLN LF (CATHETERS) ×3 IMPLANT
CHLORAPREP W/TINT 26ML (MISCELLANEOUS) ×5 IMPLANT
CLOSURE WOUND 1/4X4 (GAUZE/BANDAGES/DRESSINGS) ×1
CORD MONOPOLAR M/FML 12FT (MISCELLANEOUS) ×5 IMPLANT
COUNTER NEEDLE 20/40 LG (NEEDLE) ×5 IMPLANT
COVER LIGHT HANDLE STERIS (MISCELLANEOUS) ×10 IMPLANT
DERMABOND ADVANCED (GAUZE/BANDAGES/DRESSINGS) ×2
DERMABOND ADVANCED .7 DNX12 (GAUZE/BANDAGES/DRESSINGS) ×3 IMPLANT
DEVICE SUTURE ENDOST 10MM (ENDOMECHANICALS) ×5 IMPLANT
DRAPE STERI POUCH LG 24X46 STR (DRAPES) IMPLANT
DRSG TEGADERM 2-3/8X2-3/4 SM (GAUZE/BANDAGES/DRESSINGS) ×15 IMPLANT
GLOVE BIO SURGEON STRL SZ7 (GLOVE) ×15 IMPLANT
GLOVE INDICATOR 7.5 STRL GRN (GLOVE) ×5 IMPLANT
GOWN STRL REUS W/ TWL LRG LVL3 (GOWN DISPOSABLE) ×6 IMPLANT
GOWN STRL REUS W/ TWL XL LVL3 (GOWN DISPOSABLE) ×3 IMPLANT
GOWN STRL REUS W/TWL LRG LVL3 (GOWN DISPOSABLE) ×4
GOWN STRL REUS W/TWL XL LVL3 (GOWN DISPOSABLE) ×2
HEMOSTAT ARISTA ABSORB 3G PWDR (MISCELLANEOUS) ×5 IMPLANT
IRRIGATION STRYKERFLOW (MISCELLANEOUS) IMPLANT
IRRIGATOR STRYKERFLOW (MISCELLANEOUS)
IV LACTATED RINGERS 1000ML (IV SOLUTION) ×5 IMPLANT
IV NS 1000ML (IV SOLUTION) ×2
IV NS 1000ML BAXH (IV SOLUTION) ×3 IMPLANT
KIT PINK PAD W/HEAD ARE REST (MISCELLANEOUS) ×5
KIT PINK PAD W/HEAD ARM REST (MISCELLANEOUS) ×3 IMPLANT
KIT TURNOVER CYSTO (KITS) ×5 IMPLANT
LABEL OR SOLS (LABEL) ×5 IMPLANT
LIGASURE VESSEL 5MM BLUNT TIP (ELECTROSURGICAL) ×5 IMPLANT
MANIPULATOR VCARE LG CRV RETR (MISCELLANEOUS) IMPLANT
MANIPULATOR VCARE SML CRV RETR (MISCELLANEOUS) IMPLANT
MANIPULATOR VCARE STD CRV RETR (MISCELLANEOUS) ×5 IMPLANT
NS IRRIG 500ML POUR BTL (IV SOLUTION) ×5 IMPLANT
OCCLUDER COLPOPNEUMO (BALLOONS) ×5 IMPLANT
PACK GYN LAPAROSCOPIC (MISCELLANEOUS) ×5 IMPLANT
PAD OB MATERNITY 4.3X12.25 (PERSONAL CARE ITEMS) ×5 IMPLANT
PAD PREP 24X41 OB/GYN DISP (PERSONAL CARE ITEMS) ×5 IMPLANT
POUCH SPECIMEN RETRIEVAL 10MM (ENDOMECHANICALS) IMPLANT
SCISSORS METZENBAUM CVD 33 (INSTRUMENTS) ×5 IMPLANT
SET CYSTO W/LG BORE CLAMP LF (SET/KITS/TRAYS/PACK) ×5 IMPLANT
SLEEVE ENDOPATH XCEL 5M (ENDOMECHANICALS) ×5 IMPLANT
SPONGE GAUZE 2X2 8PLY STER LF (GAUZE/BANDAGES/DRESSINGS) ×2
SPONGE GAUZE 2X2 8PLY STRL LF (GAUZE/BANDAGES/DRESSINGS) ×8 IMPLANT
STRIP CLOSURE SKIN 1/4X4 (GAUZE/BANDAGES/DRESSINGS) ×4 IMPLANT
SURGILUBE 2OZ TUBE FLIPTOP (MISCELLANEOUS) ×5 IMPLANT
SUT ENDO VLOC 180-0-8IN (SUTURE) ×5 IMPLANT
SUT MNCRL 4-0 (SUTURE) ×4
SUT MNCRL 4-0 27XMFL (SUTURE) ×6
SUT MNCRL AB 4-0 PS2 18 (SUTURE) ×5 IMPLANT
SUT VIC AB 0 CT1 36 (SUTURE) ×5 IMPLANT
SUT VIC AB 2-0 UR6 27 (SUTURE) IMPLANT
SUT VIC AB 4-0 SH 27 (SUTURE)
SUT VIC AB 4-0 SH 27XANBCTRL (SUTURE) IMPLANT
SUTURE MNCRL 4-0 27XMF (SUTURE) ×6 IMPLANT
SYR 10ML LL (SYRINGE) ×5 IMPLANT
SYR 50ML LL SCALE MARK (SYRINGE) ×5 IMPLANT
TROCAR ENDO BLADELESS 11MM (ENDOMECHANICALS) ×5 IMPLANT
TROCAR XCEL NON-BLD 5MMX100MML (ENDOMECHANICALS) ×5 IMPLANT
TUBING INSUF HEATED (TUBING) ×5 IMPLANT
TUBING INSUFFLATION (TUBING) ×5 IMPLANT

## 2018-04-02 NOTE — Transfer of Care (Signed)
Immediate Anesthesia Transfer of Care Note  Patient: Kristi Carlson  Procedure(s) Performed: HYSTERECTOMY TOTAL LAPAROSCOPIC (N/A ) LAPAROSCOPIC BILATERAL SALPINGECTOMY (Bilateral ) LAPAROSCOPIC OOPHORECTOMY (Right )  Patient Location: PACU  Anesthesia Type:General  Level of Consciousness: awake  Airway & Oxygen Therapy: Patient Spontanous Breathing  Post-op Assessment: Report given to RN  Post vital signs: stable  Last Vitals:  Vitals Value Taken Time  BP    Temp    Pulse 87 04/02/2018 10:57 AM  Resp    SpO2 100 % 04/02/2018 10:57 AM  Vitals shown include unvalidated device data.  Last Pain:  Vitals:   04/02/18 0616  TempSrc: Temporal  PainSc: 7          Complications: No apparent anesthesia complications

## 2018-04-02 NOTE — Interval H&P Note (Signed)
History and Physical Interval Note:  04/02/2018 7:38 AM  Kristi Carlson  has presented today for surgery, with the diagnosis of AUB, pelvic pain  The various methods of treatment have been discussed with the patient and family. After consideration of risks, benefits and other options for treatment, the patient has consented to  Procedure(s): HYSTERECTOMY TOTAL LAPAROSCOPIC (N/A) LAPAROSCOPIC BILATERAL SALPINGECTOMY (Bilateral) LAPAROSCOPIC OOPHORECTOMY (Right) CYSTOSCOPY (N/A) as a surgical intervention .  The patient's history has been reviewed, patient examined, no change in status, stable for surgery.  I have reviewed the patient's chart and labs.  Questions were answered to the patient's satisfaction.     Benjaman Kindler

## 2018-04-02 NOTE — Op Note (Signed)
Kristi Carlson PROCEDURE DATE: 04/02/2018  PREOPERATIVE DIAGNOSIS:  - menometrorrhagia now with dysmenorrhea and dyspareunia - right sided adnexal pain  POSTOPERATIVE DIAGNOSIS: The same PROCEDURE: Total laparoscopic hysterectomy, bilateral salpingectomy and right oophorectomy SURGEON:  Dr. Benjaman Kindler ASSISTANT: Dr. Vikki Ports Ward Anesthesiologist:  Anesthesiologist: Gunnar Bulla, MD CRNA: Dawayne Cirri I, CRNA  INDICATIONS: 47 y.o. F with here for definitive surgical management secondary to the indications listed under preoperative diagnoses; please see preoperative note for further details.  Risks of surgery were discussed with the patient including but not limited to: bleeding which may require transfusion or reoperation; infection which may require antibiotics; injury to bowel, bladder, ureters or other surrounding organs; need for additional procedures; thromboembolic phenomenon, incisional problems and other postoperative/anesthesia complications. Written informed consent was obtained.    FINDINGS:  Normal small uterus, bilateral ovaries within normal limits, interrupted tubes bilaterally  ANESTHESIA:    General INTRAVENOUS FLUIDS:800  ml ESTIMATED BLOOD LOSS:20 ml URINE OUTPUT: 50 ml   SPECIMENS: Uterus, cervix, bilateral fallopian tubes and right ovary COMPLICATIONS: None immediate  PROCEDURE IN DETAIL:  The patient received prophalactic intravenous antibiotics and had sequential compression devices applied to her lower extremities while in the preoperative area.  She was then taken to the operating room where general anesthesia was administered and was found to be adequate.  She was placed in the dorsal lithotomy position, and was prepped and draped in a sterile manner.  A formal time out was performed with all team members present and in agreement.  A V-care uterine manipulator was placed at this time.  A Foley catheter was inserted into her bladder and attached to constant  drainage. Attention was turned to the abdomen and 0.5% Marcaine infused subq. A 37mm umbilical incision was made with the scalpel.  The Optiview 5-mm trocar and sleeve were then advanced without difficulty with the laparoscope under direct visualization into the abdomen.  The abdomen was then insufflated with carbon dioxide gas and adequate pneumoperitoneum was obtained.  A survey of the patient's pelvis and abdomen revealed the findings above.  Bilateral lower quadrant ports (5 mm on the right and 11 mm on the left) were then placed under direct visualization.  The pelvis was then carefully examined.  Attention was turned to the fallopian tubes; these were freed from the underlying mesosalpinx and the uterine attachments using the Ligasure device.  The bilateral round and broad ligaments were then clamped and transected with the Ligasure device. The right ovary was removed after confirming ureteral position, but doubly clamping the ovarian vessels, cauterizing them with bipolar, and transecting. The uterine artery was then skeletonized and a bladder flap was created.  The ureters were noted to be safely away from the area of dissection.  The bladder was then bluntly dissected off the lower uterine segment.    At this point, attention was turned to the uterine vessels, which were clamped and cauterized using the Ligasure on the left, and then the right. After the uterine blood flow at the level of the internal os was controlled, both arteries were cut with the Ligasure.  Good hemostasis was noted overall.  The uterosacral and cardinal ligaments were clamped, cut and ligated bilaterally .  Attention was then turned to the cervicovaginal junction, and monopolar scissors were used to transect the cervix from the surrounding vagina using the ring of the V-care as a guide. This was done circumferentially allowing total hysterectomy.  The uterus was then removed from the vagina and the vaginal cuff  incision was then  closed with running V-loc suture.  Overall excellent hemostasis was noted.    Attention was returned to the abdomen.The ureters were reexamined bilaterally and were pulsating normally. The abdominal pressure was reduced and hemostasis was confirmed.   Intravenous floruoceine was administered, and cystoscopy showed bilateral ureteral jets.  No stitches were visualized in the bladder during cystoscopy.  All trocars were removed under direct visualization, and the abdomen was desufflated.  All skin incisions were closed with 4-0 Vicryl subcuticular stitches and Dermabond. The patient tolerated the procedures well.  All instruments, needles, and sponge counts were correct x 2. The patient was taken to the recovery room awake, extubated and in stable condition.

## 2018-04-02 NOTE — Anesthesia Procedure Notes (Signed)
Procedure Name: Intubation Date/Time: 04/02/2018 8:57 AM Performed by: Carron Curie, CRNA Pre-anesthesia Checklist: Patient identified, Patient being monitored, Timeout performed, Emergency Drugs available and Suction available Patient Re-evaluated:Patient Re-evaluated prior to induction Oxygen Delivery Method: Circle system utilized Preoxygenation: Pre-oxygenation with 100% oxygen Induction Type: IV induction Ventilation: Mask ventilation without difficulty Laryngoscope Size: Mac and 3 Grade View: Grade I Tube type: Oral Tube size: 7.0 mm Number of attempts: 1 Airway Equipment and Method: Stylet Placement Confirmation: ETT inserted through vocal cords under direct vision,  positive ETCO2 and breath sounds checked- equal and bilateral Secured at: 21 cm Tube secured with: Tape Dental Injury: Teeth and Oropharynx as per pre-operative assessment

## 2018-04-02 NOTE — Anesthesia Postprocedure Evaluation (Signed)
Anesthesia Post Note  Patient: Kristi Carlson  Procedure(s) Performed: HYSTERECTOMY TOTAL LAPAROSCOPIC (N/A ) LAPAROSCOPIC BILATERAL SALPINGECTOMY (Bilateral ) LAPAROSCOPIC OOPHORECTOMY (Right )  Patient location during evaluation: PACU Anesthesia Type: General Level of consciousness: awake and alert Pain management: pain level controlled Vital Signs Assessment: post-procedure vital signs reviewed and stable Respiratory status: spontaneous breathing, nonlabored ventilation, respiratory function stable and patient connected to nasal cannula oxygen Cardiovascular status: blood pressure returned to baseline and stable Postop Assessment: no apparent nausea or vomiting Anesthetic complications: no     Last Vitals:  Vitals:   04/02/18 1122 04/02/18 1127  BP:  (!) 84/71  Pulse: 60 65  Resp: 12 13  Temp:    SpO2: 97% 95%    Last Pain:  Vitals:   04/02/18 1127  TempSrc:   PainSc: [place]

## 2018-04-02 NOTE — Discharge Instructions (Signed)
Discharge instructions after   total laparoscopic hysterectomy   For the next three days, take ibuprofen and acetaminophen on a schedule, every 8 hours. You can take them together or you can intersperse them, and take one every four hours. I also gave you gabapentin for nighttime, to help you sleep and also to control pain. Take gabapentin medicines at night for at least the next 3 nights. You also have a narcotic, oxycodone, to take as needed if the above medicines don't help.  Postop constipation is a major cause of pain. Stay well hydrated, walk as you tolerate, and take over the counter senna as well as stool softeners if you need them.    Signs and Symptoms to Report Call our office at [phone] if you have any of the following.  . Fever over 100.4 degrees or higher . Severe stomach pain not relieved with pain medications . Bright red bleeding that's heavier than a period that does not slow with rest . To go the bathroom a lot (frequency), you can't hold your urine (urgency), or it hurts when you empty your bladder (urinate) . Chest pain . Shortness of breath . Pain in the calves of your legs . Severe nausea and vomiting not relieved with anti-nausea medications . Signs of infection around your wounds, such as redness, hot to touch, swelling, green/yellow drainage (like pus), bad smelling discharge . Any concerns  What You Can Expect after Surgery . You may see some pink tinged, bloody fluid and bruising around the wound. This is normal. . You may notice shoulder and neck pain. This is caused by the gas used during surgery to expand your abdomen so your surgeon could get to the uterus easier. . You may have a sore throat because of the tube in your mouth during general anesthesia. This will go away in 2 to 3 days. . You may have some stomach cramps. . You may notice spotting on your panties. . You may have pain around the incision sites.   Activities after Your  Discharge Follow these guidelines to help speed your recovery at home: . Do the coughing and deep breathing as you did in the hospital for 2 weeks. Use the small blue breathing device, called the incentive spirometer for 2 weeks. . Don't drive if you are in pain or taking narcotic pain medicine. You may drive when you can safely slam on the brakes, turn the wheel forcefully, and rotate your torso comfortably. This is typically 1-2 weeks. Practice in a parking lot or side street prior to attempting to drive regularly.  . Ask others to help with household chores for 4 weeks. . Do not lift anything heavier that 10 pounds for 4-6 weeks. This includes pets, children, and groceries. . Don't do strenuous activities, exercises, or sports like vacuuming, tennis, squash, etc. until your doctor says it is safe to do so. ---Maintain pelvic rest for 8 weeks. This means nothing in the vagina or rectum at all (no douching, tampons, intercourse) for 8 weeks.  . Walk as you feel able. Rest often since it may take two or three weeks for your energy level to return to normal.  . You may climb stairs . Avoid constipation:   -Eat fruits, vegetables, and whole grains. Eat small meals as your appetite will take time to return to normal.   -Drink 6 to 8 glasses of water each day unless your doctor has told you to limit your fluids.   -Use a laxative   or stool softener as needed if constipation becomes a problem. You may take Miralax, metamucil, Citrucil, Colace, Senekot, FiberCon, etc. If this does not relieve the constipation, try two tablespoons of Milk Of Magnesia every 8 hours until your bowels move.  . You may shower. Gently wash the wounds with a mild soap and water. Pat dry. . Do not get in a hot tub, swimming pool, etc. for 6 weeks. . Do not use lotions, oils, powders on the wounds. . Do not douche, use tampons, or have sex until your doctor says it is okay. . Take your pain medicine when you need it. The medicine  may not work as well if the pain is bad.  Take the medicines you were taking before surgery. Other medications you will need are pain medications and possibly constipation and nausea medications (Zofran).    AMBULATORY SURGERY  DISCHARGE INSTRUCTIONS   1) The drugs that you were given will stay in your system until tomorrow so for the next 24 hours you should not:  A) Drive an automobile B) Make any legal decisions C) Drink any alcoholic beverage   2) You may resume regular meals tomorrow.  Today it is better to start with liquids and gradually work up to solid foods.  You may eat anything you prefer, but it is better to start with liquids, then soup and crackers, and gradually work up to solid foods.   3) Please notify your doctor immediately if you have any unusual bleeding, trouble breathing, redness and pain at the surgery site, drainage, fever, or pain not relieved by medication.    4) Additional Instructions:        Please contact your physician with any problems or Same Day Surgery at [phone], Monday through Friday 6 am to 4 pm, or [place] at [place] Main number at [phone]. 

## 2018-04-02 NOTE — OR Nursing (Signed)
Discussed discharge instructions with pt and husband. Both voice understanding. 

## 2018-04-02 NOTE — Anesthesia Preprocedure Evaluation (Signed)
Anesthesia Evaluation  Patient identified by MRN, date of birth, ID band Patient awake    Reviewed: Allergy & Precautions, NPO status , Patient's Chart, lab work & pertinent test results, reviewed documented beta blocker date and time   Airway Mallampati: II  TM Distance: >3 FB     Dental  (+) Chipped   Pulmonary asthma , Current Smoker,           Cardiovascular      Neuro/Psych  Headaches,    GI/Hepatic   Endo/Other    Renal/GU      Musculoskeletal   Abdominal   Peds  Hematology   Anesthesia Other Findings   Reproductive/Obstetrics                             Anesthesia Physical Anesthesia Plan  ASA: II  Anesthesia Plan: General   Post-op Pain Management:    Induction: Intravenous  PONV Risk Score and Plan:   Airway Management Planned: Oral ETT  Additional Equipment:   Intra-op Plan:   Post-operative Plan:   Informed Consent: I have reviewed the patients History and Physical, chart, labs and discussed the procedure including the risks, benefits and alternatives for the proposed anesthesia with the patient or authorized representative who has indicated his/her understanding and acceptance.     Plan Discussed with: CRNA  Anesthesia Plan Comments:         Anesthesia Quick Evaluation

## 2018-04-02 NOTE — Anesthesia Post-op Follow-up Note (Signed)
Anesthesia QCDR form completed.        

## 2018-04-05 LAB — SURGICAL PATHOLOGY

## 2018-06-01 ENCOUNTER — Other Ambulatory Visit: Payer: Self-pay | Admitting: Orthopedic Surgery

## 2018-06-01 DIAGNOSIS — M25512 Pain in left shoulder: Secondary | ICD-10-CM

## 2018-08-19 ENCOUNTER — Other Ambulatory Visit: Payer: Self-pay | Admitting: Orthopedic Surgery

## 2018-08-19 DIAGNOSIS — M25512 Pain in left shoulder: Secondary | ICD-10-CM

## 2018-08-24 ENCOUNTER — Ambulatory Visit: Admission: RE | Admit: 2018-08-24 | Payer: Federal, State, Local not specified - PPO | Source: Ambulatory Visit

## 2019-09-09 IMAGING — XA DG C-ARM 61-120 MIN
1 series · 1 of 1 positions shown · non-contrast
Comparison: None.

CLINICAL DATA: Left shoulder elective surgery.

EXAM:
LEFT HUMERUS - 2+ VIEW; DG C-ARM 61-120 MIN

[Series 1: cont. · 1 of 1 slices shown]
[im 1/1]
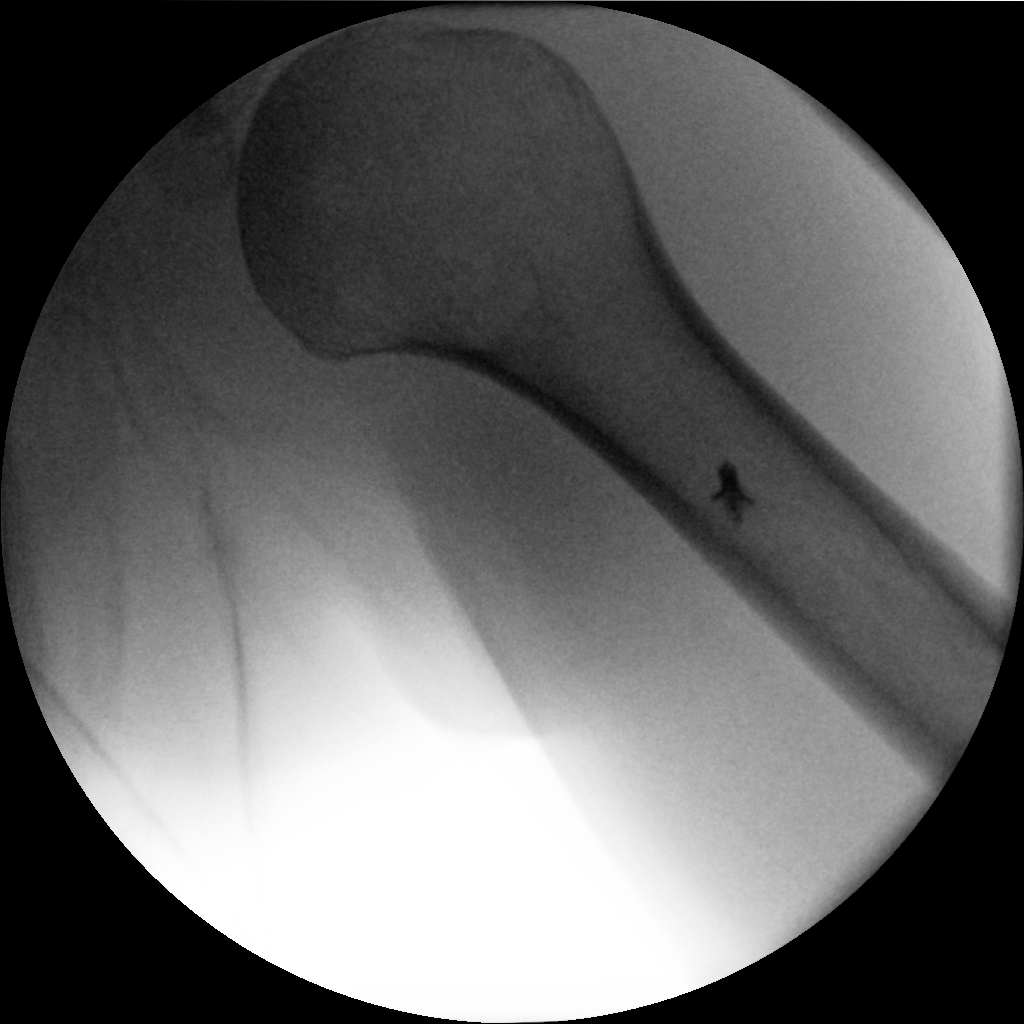

[1 of 1 positions shown; findings below may reference images not displayed]

FINDINGS: Single image demonstrates a small retention anchor projected over
the proximal left humerus.
IMPRESSION: As above.

## 2019-11-14 ENCOUNTER — Other Ambulatory Visit: Payer: Self-pay | Admitting: Orthopedic Surgery

## 2019-11-14 DIAGNOSIS — M25512 Pain in left shoulder: Secondary | ICD-10-CM

## 2019-12-23 ENCOUNTER — Ambulatory Visit: Payer: Federal, State, Local not specified - PPO | Attending: Internal Medicine

## 2019-12-23 DIAGNOSIS — Z20822 Contact with and (suspected) exposure to covid-19: Secondary | ICD-10-CM

## 2019-12-24 LAB — NOVEL CORONAVIRUS, NAA: SARS-CoV-2, NAA: NOT DETECTED

## 2021-08-28 ENCOUNTER — Encounter: Payer: Self-pay | Admitting: Emergency Medicine

## 2021-08-28 ENCOUNTER — Ambulatory Visit
Admission: EM | Admit: 2021-08-28 | Discharge: 2021-08-28 | Disposition: A | Discharge status: Home / Self Care | Payer: Federal, State, Local not specified - PPO | Attending: Emergency Medicine | Admitting: Emergency Medicine

## 2021-08-28 ENCOUNTER — Other Ambulatory Visit: Payer: Self-pay

## 2021-08-28 DIAGNOSIS — K047 Periapical abscess without sinus: Secondary | ICD-10-CM | POA: Diagnosis not present

## 2021-08-28 MED ORDER — TRAMADOL HCL 50 MG PO TABS: 50.0000 mg | ORAL_TABLET | Freq: Four times a day (QID) | ORAL | 0 refills | Status: AC | PRN

## 2021-08-28 MED ORDER — AMOXICILLIN-POT CLAVULANATE 875-125 MG PO TABS: 1.0000 | ORAL_TABLET | Freq: Two times a day (BID) | ORAL | 0 refills | Status: AC

## 2021-08-28 NOTE — Discharge Instructions (Addendum)
Take the Augmentin twice daily with food for 10 days for treatment of your dental infection.  Use over-the-counter Tylenol and ibuprofen for swelling and mild to moderate pain.  Use the tramadol every 6 hours as needed for severe pain.  This medication will sedate you so do not drink alcohol and do not drive if you take it.  Also do not operate machinery or enter into any contracts.  Rinse with warm salt water, or Listerine, after each meal to remove food particles and wash away any pus that is collecting.  If you develop any increasing or swelling, fever, pain, or difficulty swallowing you to go to the emergency department at [facility] with a have an oral surgeon and also a dentist on-call.

## 2021-08-28 NOTE — ED Provider Notes (Signed)
[facility]    CSN: [id number] Arrival date & time: 08/28/21  0801      History   Chief Complaint Chief Complaint  Patient presents with   Dental Pain    Right     HPI Kristi Carlson is a 50 y.o. female.   HPI  50 year old female here for evaluation of right-sided facial swelling.  Patient reports that she has been experiencing pain on the right side of her mandible for the past 2 days.  She states that its her wisdom tooth that has to be extracted.  She does have a dentist but she has not contacted them.  She states typically she can manage swelling with salt water gargles, ice, and ibuprofen but that has not been helping this time.  She is concerned because the swelling has moved forward to the right side of her lower lip and she is having trouble opening her mouth.  She denies any trouble swallowing or difficulty breathing.  Patient is a smoker.  Past Medical History:  Diagnosis Date   Asthma    Bulging lumbar disc    Headache    MIGRAINES   Osteoporosis     There are no problems to display for this patient.   Past Surgical History:  Procedure Laterality Date   LAPAROSCOPIC BILATERAL SALPINGECTOMY Bilateral 04/02/2018   Procedure: LAPAROSCOPIC BILATERAL SALPINGECTOMY;  Surgeon: Benjaman Kindler, MD;  Location: [facility] ORS;  Service: Gynecology;  Laterality: Bilateral;   LAPAROSCOPIC HYSTERECTOMY N/A 04/02/2018   Procedure: HYSTERECTOMY TOTAL LAPAROSCOPIC;  Surgeon: Benjaman Kindler, MD;  Location: [facility] ORS;  Service: Gynecology;  Laterality: N/A;   SHOULDER ARTHROSCOPY Left    SHOULDER ARTHROSCOPY WITH OPEN ROTATOR CUFF REPAIR Left 12/14/2017   Procedure: SHOULDER ARTHROSCOPY WITH CAPSULAR RELEASE, RELEASE OF ADHEISIONS SUBACROMIAL BURSECTOMY AND MANIPULATION UNDER ANESTHESAI;  Surgeon: Leim Fabry, MD;  Location: [facility] ORS;  Service: Orthopedics;  Laterality: Left;   TUBAL LIGATION      OB History   No obstetric history on file.      Home Medications     Prior to Admission medications   Medication Sig Start Date End Date Taking? Authorizing Provider  amoxicillin-clavulanate (AUGMENTIN) 875-125 MG tablet Take 1 tablet by mouth every 12 (twelve) hours for 10 days. 08/28/21 09/07/21 Yes Margarette Canada, NP  traMADol (ULTRAM) 50 MG tablet Take 1 tablet (50 mg total) by mouth every 6 (six) hours as needed. 08/28/21  Yes Margarette Canada, NP  albuterol (PROVENTIL HFA;VENTOLIN HFA) 108 (90 Base) MCG/ACT inhaler Inhale 2 puffs into the lungs every 6 (six) hours as needed for wheezing or shortness of breath. 02/24/16   Juline Patch, MD  baclofen (LIORESAL) 20 MG tablet Take 20 mg by mouth 3 (three) times daily.    [provider]  docusate sodium (COLACE) 100 MG capsule Take 1 capsule (100 mg total) by mouth 2 (two) times daily. To keep stools soft 04/02/18   Benjaman Kindler, MD  fluticasone (FLOVENT HFA) 110 MCG/ACT inhaler Inhale 2 puffs once daily for 7 days then inhale 1 puff once a day for 7 days    [provider]  gabapentin (NEURONTIN) 800 MG tablet Take 1 tablet (800 mg total) by mouth at bedtime for 14 days. Take nightly for 3 days, then up to 14 days as needed 04/02/18 04/16/18  Benjaman Kindler, MD  ibuprofen (ADVIL,MOTRIN) 800 MG tablet Take 1 tablet (800 mg total) by mouth every 8 (eight) hours as needed for moderate pain. 04/02/18   Leafy Ro,  Romelle Starcher, MD  linaclotide (LINZESS) 290 MCG CAPS capsule Take 290 mcg by mouth daily before breakfast.    [provider]  meloxicam (MOBIC) 7.5 MG tablet Take 7.5 mg by mouth 2 (two) times daily.    [provider]  Multiple Vitamin (MULTIVITAMIN WITH MINERALS) TABS tablet Take 1 tablet by mouth daily.    [provider]    Family History History reviewed. No pertinent family history.  Social History Social History   Tobacco Use   Smoking status: Every Day    Packs/day: 0.50    Types: Cigarettes   Smokeless tobacco: Never  Vaping Use   Vaping Use: Never used   Substance Use Topics   Alcohol use: Yes    Alcohol/week: 1.0 standard drink    Types: 1 Glasses of wine per week   Drug use: No     Allergies   Other   Review of Systems Review of Systems  Constitutional:  Negative for fever.  HENT:  Positive for facial swelling. Negative for trouble swallowing and voice change.   Skin:  Positive for color change.  Hematological:  Negative for adenopathy.    Physical Exam Triage Vital Signs ED Triage Vitals [08/28/21 0816]  Enc Vitals Group     BP      Pulse      Resp      Temp      Temp src      SpO2      Weight      Height      Head Circumference      Peak Flow      Pain Score 7     Pain Loc      Pain Edu?      Excl. in [place]?    No data found.  Updated Vital Signs BP 119/82 (BP Location: Right Arm)   Pulse 89   Temp 98.6 F (37 C) (Oral)   Resp 18   SpO2 98%   Visual Acuity Right Eye Distance:   Left Eye Distance:   Bilateral Distance:    Right Eye Near:   Left Eye Near:    Bilateral Near:     Physical Exam Vitals and nursing note reviewed.  Constitutional:      General: She is not in acute distress.    Appearance: Normal appearance. She is normal weight. She is not ill-appearing.  HENT:     Head: Normocephalic and atraumatic.     Mouth/Throat:     Pharynx: Posterior oropharyngeal erythema present. No oropharyngeal exudate.  Musculoskeletal:     Cervical back: Normal range of motion and neck supple.  Lymphadenopathy:     Cervical: No cervical adenopathy.  Skin:    General: Skin is warm and dry.     Capillary Refill: Capillary refill takes less than 2 seconds.     Findings: No erythema or rash.  Neurological:     General: No focal deficit present.     Mental Status: She is alert and oriented to person, place, and time.  Psychiatric:        Mood and Affect: Mood normal.        Behavior: Behavior normal.        Thought Content: Thought content normal.        Judgment: Judgment normal.     UC  Treatments / Results  Labs (all labs ordered are listed, but only abnormal results are displayed) Labs Reviewed - No data to display  EKG  Radiology No results found.  Procedures Procedures (including critical care time)  Medications Ordered in UC Medications - No data to display  Initial Impression / Assessment and Plan / UC Course  I have reviewed the triage vital signs and the nursing notes.  Pertinent labs & imaging results that were available during my care of the patient were reviewed by me and considered in my medical decision making (see chart for details).  Patient is a nontoxic-appearing 50 year old female here for evaluation of right-sided facial swelling that began 2 days ago and worsened overnight.  She states that she is having trouble opening her mouth due to the pain but she does not have any trouble swallowing, managing her secretions, and can speak in full complete sentences.  Patient is a smoker.  Patient's physical exam reveals a hard knot along the anterior lateral side of the right mandible.  The swelling does not extend into the submental area and there is no cervical or submental lymphadenopathy appreciated on exam.  Intraoral exam reveals erythema along the buccal margin of the right posterior gumline.  The rear molar has a significant filling and is tender to pressure.  There is no bogginess in the floor of the mouth.  I do not appreciate any discharge from around any of her teeth.  She is missing a rare molar.  Will place patient on Augmentin and have her continue ibuprofen for mild to moderate pain.  We will give tramadol for more severe pain.  Patient advised that if her swelling increases she needs to go to the [facility] ER to be evaluated by oral maxillofacial.   Final Clinical Impressions(s) / UC Diagnoses   Final diagnoses:  Dental abscess     Discharge Instructions      Take the Augmentin twice daily with food for 10 days for treatment of  your dental infection.  Use over-the-counter Tylenol and ibuprofen for swelling and mild to moderate pain.  Use the tramadol every 6 hours as needed for severe pain.  This medication will sedate you so do not drink alcohol and do not drive if you take it.  Also do not operate machinery or enter into any contracts.  Rinse with warm salt water, or Listerine, after each meal to remove food particles and wash away any pus that is collecting.  If you develop any increasing or swelling, fever, pain, or difficulty swallowing you to go to the emergency department at [facility] with a have an oral surgeon and also a dentist on-call.      ED Prescriptions     Medication Sig Dispense Auth. Provider   amoxicillin-clavulanate (AUGMENTIN) 875-125 MG tablet Take 1 tablet by mouth every 12 (twelve) hours for 10 days. 20 tablet Margarette Canada, NP   traMADol (ULTRAM) 50 MG tablet Take 1 tablet (50 mg total) by mouth every 6 (six) hours as needed. 15 tablet Margarette Canada, NP      I have reviewed the PDMP during this encounter.   Margarette Canada, NP 08/28/21 [phone]

## 2021-08-28 NOTE — ED Triage Notes (Signed)
Pt presents today with c/o of right dental pain. She reports having pain from wisdom tooth coming in to right lower gum. This morning she woke up with swelling/pain to right front gum. Pain 7/10
